# Patient Record
Sex: Female | Born: 1950 | Race: White | Hispanic: No | Marital: Married | Smoking: Never smoker
Health system: Southern US, Community
[De-identification: ages and names within clinical notes are randomized; demographics above are authoritative.]

## PROBLEM LIST (undated history)

## (undated) DIAGNOSIS — N83201 Unspecified ovarian cyst, right side: Secondary | ICD-10-CM

## (undated) DIAGNOSIS — N800 Endometriosis of uterus: Secondary | ICD-10-CM

## (undated) DIAGNOSIS — N83202 Unspecified ovarian cyst, left side: Secondary | ICD-10-CM

## (undated) DIAGNOSIS — I491 Atrial premature depolarization: Secondary | ICD-10-CM

## (undated) DIAGNOSIS — E7800 Pure hypercholesterolemia, unspecified: Secondary | ICD-10-CM

## (undated) DIAGNOSIS — N939 Abnormal uterine and vaginal bleeding, unspecified: Secondary | ICD-10-CM

## (undated) DIAGNOSIS — I499 Cardiac arrhythmia, unspecified: Secondary | ICD-10-CM

## (undated) DIAGNOSIS — K589 Irritable bowel syndrome without diarrhea: Secondary | ICD-10-CM

## (undated) DIAGNOSIS — N809 Endometriosis, unspecified: Secondary | ICD-10-CM

## (undated) DIAGNOSIS — D8989 Other specified disorders involving the immune mechanism, not elsewhere classified: Secondary | ICD-10-CM

## (undated) DIAGNOSIS — Z9189 Other specified personal risk factors, not elsewhere classified: Secondary | ICD-10-CM

## (undated) DIAGNOSIS — N8003 Adenomyosis of the uterus: Secondary | ICD-10-CM

## (undated) DIAGNOSIS — D219 Benign neoplasm of connective and other soft tissue, unspecified: Secondary | ICD-10-CM

## (undated) DIAGNOSIS — I493 Ventricular premature depolarization: Secondary | ICD-10-CM

## (undated) DIAGNOSIS — K219 Gastro-esophageal reflux disease without esophagitis: Secondary | ICD-10-CM

## (undated) HISTORY — DX: Abnormal uterine and vaginal bleeding, unspecified: N93.9

## (undated) HISTORY — DX: Pure hypercholesterolemia, unspecified: E78.00

## (undated) HISTORY — DX: Unspecified ovarian cyst, right side: N83.201

## (undated) HISTORY — DX: Irritable bowel syndrome, unspecified: K58.9

## (undated) HISTORY — DX: Unspecified ovarian cyst, left side: N83.202

## (undated) HISTORY — DX: Endometriosis of uterus: N80.0

## (undated) HISTORY — DX: Other specified personal risk factors, not elsewhere classified: Z91.89

## (undated) HISTORY — DX: Benign neoplasm of connective and other soft tissue, unspecified: D21.9

## (undated) HISTORY — DX: Adenomyosis of the uterus: N80.03

## (undated) HISTORY — DX: Endometriosis, unspecified: N80.9

## (undated) HISTORY — PX: OTHER SURGICAL HISTORY: SHX169

## (undated) HISTORY — DX: Other specified disorders involving the immune mechanism, not elsewhere classified: D89.89

---

## 1999-08-23 HISTORY — PX: VAGINAL HYSTERECTOMY: SUR661

## 1999-12-28 ENCOUNTER — Encounter (INDEPENDENT_AMBULATORY_CARE_PROVIDER_SITE_OTHER): Payer: Self-pay | Admitting: Specialist

## 1999-12-28 ENCOUNTER — Inpatient Hospital Stay (HOSPITAL_COMMUNITY): Admission: RE | Admit: 1999-12-28 | Discharge: 1999-12-30 | Payer: Self-pay | Admitting: Obstetrics and Gynecology

## 2002-11-14 ENCOUNTER — Other Ambulatory Visit: Admission: RE | Admit: 2002-11-14 | Discharge: 2002-11-14 | Payer: Self-pay | Admitting: Obstetrics and Gynecology

## 2003-11-26 ENCOUNTER — Other Ambulatory Visit: Admission: RE | Admit: 2003-11-26 | Discharge: 2003-11-26 | Payer: Self-pay | Admitting: Obstetrics and Gynecology

## 2004-11-30 ENCOUNTER — Other Ambulatory Visit: Admission: RE | Admit: 2004-11-30 | Discharge: 2004-11-30 | Payer: Self-pay | Admitting: Obstetrics and Gynecology

## 2005-07-21 ENCOUNTER — Encounter (INDEPENDENT_AMBULATORY_CARE_PROVIDER_SITE_OTHER): Payer: Self-pay | Admitting: Specialist

## 2005-07-21 ENCOUNTER — Ambulatory Visit (HOSPITAL_BASED_OUTPATIENT_CLINIC_OR_DEPARTMENT_OTHER): Admission: RE | Admit: 2005-07-21 | Discharge: 2005-07-21 | Payer: Self-pay | Admitting: Obstetrics and Gynecology

## 2005-07-21 HISTORY — PX: OOPHORECTOMY: SHX86

## 2005-12-05 ENCOUNTER — Other Ambulatory Visit: Admission: RE | Admit: 2005-12-05 | Discharge: 2005-12-05 | Payer: Self-pay | Admitting: Obstetrics and Gynecology

## 2006-12-13 ENCOUNTER — Other Ambulatory Visit: Admission: RE | Admit: 2006-12-13 | Discharge: 2006-12-13 | Payer: Self-pay | Admitting: Obstetrics and Gynecology

## 2008-01-02 ENCOUNTER — Other Ambulatory Visit: Admission: RE | Admit: 2008-01-02 | Discharge: 2008-01-02 | Payer: Self-pay | Admitting: Obstetrics and Gynecology

## 2009-03-11 ENCOUNTER — Other Ambulatory Visit: Admission: RE | Admit: 2009-03-11 | Discharge: 2009-03-11 | Payer: Self-pay | Admitting: Obstetrics and Gynecology

## 2009-03-11 ENCOUNTER — Ambulatory Visit: Payer: Self-pay | Admitting: Obstetrics and Gynecology

## 2009-03-11 ENCOUNTER — Encounter: Payer: Self-pay | Admitting: Obstetrics and Gynecology

## 2009-04-06 ENCOUNTER — Ambulatory Visit: Payer: Self-pay | Admitting: Obstetrics and Gynecology

## 2010-02-16 ENCOUNTER — Ambulatory Visit: Payer: Self-pay | Admitting: Gynecology

## 2010-03-12 ENCOUNTER — Other Ambulatory Visit: Admission: RE | Admit: 2010-03-12 | Discharge: 2010-03-12 | Payer: Self-pay | Admitting: Obstetrics and Gynecology

## 2010-03-12 ENCOUNTER — Ambulatory Visit: Payer: Self-pay | Admitting: Obstetrics and Gynecology

## 2011-01-07 NOTE — H&P (Signed)
[facility]  Patient:    Shelia May, Shelia May                       MRN: [id number] Attending:  Rande Brunt. Eda Paschal, M.D.                         History and Physical  CHIEF COMPLAINT:  Symptomatic fibroids.  HISTORY OF PRESENT ILLNESS:  The patient is a 60 year old, gravida 2, para 2, AB 0 who has been followed in the office for a long period of time. Approximately four to five years ago, she started to complain of severe menorrhagia with small fibroids. She was started on oral contraceptives with remission of her symptoms. She had a previous D&C for evaluation of this which was fine. She did well on her oral contraceptives for a while. They were switched to continuous at one point because of menstrual headaches but she continued to well without menorrhagia. Then approximately a year ago, she started to complain of lower abdominal pelvic crampy pain. On ultrasound, she had multiple leiomyoma. She had a total of 6 of them the largest of which was 3 cm. She also had 1 that was 2 cm the others were smaller. Because her pain was slightly atypical, she also had a GI evaluation including a negative colonoscopy.  The pain persisted, no medication was successful and it was felt that the only thing left to explain her pain was her fibroids and therefore she was placed on Depot Lupron. In November 2000, her pills were stopped and she was watched to see if that would control her pain. Indeed it did control her pain, her pain completely disappeared. The fibroids got smaller. She stayed on that until the end of April. The Depot Lupron was continued and her pain immediately resurfaced. She now enters the hospital for a vaginal hysterectomy to remove the fibroids to control the menorrhagia and hopefully to control her pain. She has given me permission to remove one or both ovaries for disease only.  PAST MEDICAL HISTORY:  No serious illnesses. 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 Has also been treated with Prevacid for chronic GI disorder. Present medications, however, are just her vitamins and her oral contraceptives.  ALLERGIES:  FAMILY HISTORY:  Negative for diabetes, hypertension, colon, ovarian or uterine cancer, breast cancer or heart disease.  SOCIAL HISTORY:  She is a nonsmoker. She drinks alcohol occasionally and she has caffeine frequently.  REVIEW OF SYSTEMS:  HEENT:  Negative.  CARDIAC:  Negative. RESPIRATORY: Negative. 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 GU:  Negative. NEUROMUSCULAR:  Negative.  ENDOCRINE: Negative.  PHYSICAL EXAMINATION:  GENERAL:  The patient is a well-developed, well-nourished female in no acute distress.  VITAL SIGNS:  Blood pressure is 114/66, pulse is 80 and regular, respiratory rate 16 nonlabored. She is afebrile.  HEENT:  All within normal limits.  NECK:  Supple, trach in the midline.  Thyroid is not enlarged.  LUNGS:  Clear to P&A.  HEART:  No thrills heaves or murmurs.  BREASTS:  No masses.  ABDOMEN:  Soft without guarding, rebound or mass.  PELVIC:  External and vaginal is within normal limits. Cervix is clean. Pap smear shows no atypia. Uterus is top normal size and shape with her myomas having shrunk from her Lupron. Adnexa  are palpably normal.  RECTAL:  Negative.  EXTREMITIES:  Within normal limits.  ADMISSION IMPRESSION:  Symptomatic fibroids.  PLAN:  Vaginal hysterectomy for correction of the above. DD:  12/28/99 TD:  12/28/99 Job: [id number]

## 2011-01-07 NOTE — Discharge Summary (Signed)
[facility]  Patient:    Shelia May, Shelia May                  MRN: [id number] Adm. Date:  [id number] Disc. Date: [id number] Attending:  Sharon Mt                           Discharge Summary  HISTORY:  The patient is a 60 year old female who came in with symptomatic fibroids with abdominal cramping, dysmenorrhea and menorrhagia for definitive surgery.  On the day of admission she was taken to the operating room and a vaginal hysterectomy was performed.  It was done under epidural anesthesia and the night of the surgery and the next day the patient had a lot of trouble with severe nausea and vomiting.  The epidural was discontinued.  This helped somewhat, but it continued when we started PCA morphine.  After stopping the PCA morphine the nausea finally cleared.  By the second postoperative day she was voiding well, passing gas, and was ready for discharge.  DISCHARGE MEDICATIONS:  Motrin 800 mg every 4-6 hours p.r.n. pain with a prescription for Tylox for backup if needed.  DISCHARGE DIET:  Soft, to advance as tolerated.  DRUG ACTIVITY:  Regular.  The patient is to return to the office in 3-4 weeks for followup.  FINAL PATHOLOGY REPORT:  Multiple leiomyoma and adenomyosis.  CONDITION ON DISCHARGE:  Improved.  DISCHARGE DIAGNOSES:  1. Adenomyosis.  2. Leiomyoma.  3. Dysmenorrhea.  4. Menorrhagia.  5. Abdominal cramping.  OPERATION:  Vaginal hysterectomy. DD:  12/30/99 TD:  01/03/00 Job: [id number]

## 2011-01-07 NOTE — Op Note (Signed)
[facility]  Patient:    Shelia May, Shelia May                  MRN: [id number] Proc. Date: 12/28/99 Adm. Date:  [id number] Attending:  Sharon Mt                           Operative Report  PREOPERATIVE DIAGNOSIS:  Symptomatic fibroids.  POSTOPERATIVE DIAGNOSIS:  Symptomatic fibroids.  OPERATION:  Vaginal hysterectomy.  SURGEON:  Daniel L. Eda Paschal, M.D.  FIRST ASSISTANT:  Douglass Rivers, M.D.  ANESTHESIA:  Epidural.  FINDINGS:  At the time of hysterectomy, patients uterus was just barely enlarged.  She has had multiple myomas seen on ultrasound but has been on Depo-Lupron and they have been suppressed.  Ovaries, fallopian tubes and pelvic peritoneum were free of any disease.  DESCRIPTION OF PROCEDURE:  After adequate epidural anesthesia, patient was taken to the operating room, placed in the dorsal lithotomy position and prepped and draped in the usual sterile manner.  A 1:200,000 solution of epinephrine and 0.5% Xylocaine was injected around the cervix.  A 360 degree incision was made around the cervix.  The bladder was mobilized superiorly, as was the posterior peritoneum.  Posterior peritoneum and vesicouterine fold of peritoneum were entered by sharp dissection.  The uterosacral ligaments were clamped and in clamping, they were shortened; they were then sutured to the vaginal cuff laterally for good vault support.  Cardinal ligaments were clamped, cut and suture ligated.  Uterine arteries were clamped, cut and doubly suture-ligated.  Balance of the broad ligament, uteroovarian ligaments, round ligaments and fallopian tubes were clamped, cut and doubly suture-ligated.  Suture material for all the above-mentioned pedicles was #1 chromic catgut.  The uterus was delivered and sent to pathology for tissue diagnosis.  The ovaries, tubes and pelvic peritoneum were inspected and were free of disease.  The vaginal cuff was whipstitched with  a running-locking 0 Vicryl.  Copious irrigation was done with sterile saline.  A modified McCalls enterocele prevention suture was placed with 2-0 Vicryl, incorporating the uterosacral ligaments and the posterior peritoneum.  The cuff and peritoneum were closed with figure-of-eights of #1 chromic catgut. Estimated blood loss for the entire procedure was less than 50 cc with none replaced.  At termination of procedure, Foley catheter was placed which drained clear urine.  Patient left the operating room in satisfactory condition.DD:  12/28/99 TD:  12/29/99 Job: [id number]

## 2011-01-07 NOTE — Op Note (Signed)
NAME:  Shelia May, Shelia May              ACCOUNT NO.:  [id number]   MEDICAL RECORD NO.:  [id number]          PATIENT TYPE:  AMB   LOCATION:  [facility]                         FACILITY:  [facility]   PHYSICIAN:  Daniel L. Gottsegen, M.D.DATE OF BIRTH:  01-13-1951   DATE OF PROCEDURE:  07/21/2005  DATE OF DISCHARGE:                                 OPERATIVE REPORT   PREOPERATIVE DIAGNOSIS:  Pelvic pain, recurrent ovarian cysts, endometriosis  suspected.   POSTOPERATIVE DIAGNOSIS:  Pelvic pain, recurrent ovarian cysts,  endometriosis.   OPERATIONS:  Diagnostic laparoscopy with lysis of pelvic adhesions,  bilateral salpingo-oophorectomy.   SURGEON:  Dr. Eda Paschal   FIRST ASSISTANT:  Dr. Audie Box   ANESTHESIA:  General endotracheal.   INDICATIONS:  The patient is a 60 year old female, postmenopausal, status  post vaginal hysterectomy for dysmenorrhea DUB with endometriosis found,  who, over the past year has had progressive increase of pelvic pain,  especially on the left side.  On several occasions, she has had ovarian  cysts which did spontaneously resolve, but her pain has persisted.  Her  estrogen was stopped, and she was started on Depo-Provera, and her pain  completely resolved; however, she was symptomatic in terms of vasomotor  symptoms and as soon as her estrogen was reinitiated, her pain reoccurred.  She now enters the hospital for diagnostic laparoscopy with bilateral  salpingo-oophorectomy.   FINDINGS AT SURGERY:  The patient's left ovary appeared enlarged, especially  for a postmenopausal woman.  There was a clear pigmented area of  endometriosis associated with the left ovary.  Right ovary and tube were  normal, although the right ovary was adherent to epiploa of the large bowel.  There were also some adhesions of large bowel to the vesicouterine fold of  peritoneum.   PROCEDURE:  After adequate general endotracheal anesthesia, the patient was  placed in the dorsal supine  position, prepped and draped in the usual  sterile manner.  Foley catheter was inserted into the patient's bladder.  A  sponge stick was placed in the vagina.  A pneumoperitoneum was created  subumbilically with a Veress needle without incident.  The incision was  extended.  A 10 mm trocar and the diagnostic laparoscope was placed through  the same incision.  The pelvis was visualized.  There had been no injury  during the above, and the above findings were noted.  Two 5 mm ports were  placed in the pelvis, one on the right and one in the left.  Through that, a  variety of instruments were placed.  First, the left ovary and tube were  removed.  The ovary and tube were elevated.  The ureter was identified.  The  IP ligament was bipolared and cut, and the rest of the attachments to the  adnexa to the broad ligament were bipolared and cut.  At this point, the  adhesions described previously were seen and using a scissors attached to  unipolar current, keeping very far from the sigmoid colon, all adhesions  could be freed up so that the large bowel was now free.  There were several  oozers  in the mesentery which were controlled with precise bipolar  coagulation.  Attention was next turned to the right ovary.  The ureter was  identified.  The IP ligament was bipolared and cut, as were the rest of the  attachments of the right ovary to the broad ligament.  Now both adnexa were  free.  There was no bleeding noted.  A 5 mm laparoscope was placed in the  pelvis.  An Endopouch was placed subumbilically, and the specimens were  removed and sent to pathology for tissue diagnosis.  Copious irrigation had  been done with Ringer's lactate prior to this.  All trocars were removed.  The subumbilical fascial incision was closed with 0 Vicryl, and the 3 skin  incisions were closed with 3-0 Monocryl.  Estimated blood loss for the  entire procedure was less than 50 mL with none replaced.  The patient  tolerated  the procedure well and left the operating room in satisfactory  condition, draining clear urine from her Foley catheter which was to be  removed.      Daniel L. Eda Paschal, M.D.  Electronically Signed     DLG/MEDQ  D:  07/21/2005  T:  07/21/2005  Job:  [id number]

## 2011-03-02 ENCOUNTER — Encounter: Payer: Self-pay | Admitting: Anesthesiology

## 2011-03-14 ENCOUNTER — Other Ambulatory Visit: Payer: Self-pay | Admitting: Obstetrics and Gynecology

## 2011-03-14 MED ORDER — ESTRADIOL 1 MG PO TABS: 1.0000 mg | ORAL_TABLET | Freq: Every day | ORAL | Status: DC

## 2011-03-14 NOTE — Telephone Encounter (Signed)
CALLED IN RX TO [facility]. PT HAS AEX PENDING ON 04/15/11.

## 2011-04-15 ENCOUNTER — Ambulatory Visit (INDEPENDENT_AMBULATORY_CARE_PROVIDER_SITE_OTHER): Payer: BC Managed Care – PPO | Admitting: Obstetrics and Gynecology

## 2011-04-15 ENCOUNTER — Other Ambulatory Visit (HOSPITAL_COMMUNITY)
Admission: RE | Admit: 2011-04-15 | Discharge: 2011-04-15 | Disposition: A | Discharge status: Home / Self Care | Payer: BC Managed Care – PPO | Source: Ambulatory Visit | Attending: Obstetrics and Gynecology | Admitting: Obstetrics and Gynecology

## 2011-04-15 ENCOUNTER — Encounter: Payer: Self-pay | Admitting: Obstetrics and Gynecology

## 2011-04-15 VITALS — BP 120/70 | Ht 62.0 in | Wt 175.0 lb

## 2011-04-15 DIAGNOSIS — N939 Abnormal uterine and vaginal bleeding, unspecified: Secondary | ICD-10-CM | POA: Insufficient documentation

## 2011-04-15 DIAGNOSIS — N8003 Adenomyosis of the uterus: Secondary | ICD-10-CM | POA: Insufficient documentation

## 2011-04-15 DIAGNOSIS — Z780 Asymptomatic menopausal state: Secondary | ICD-10-CM

## 2011-04-15 DIAGNOSIS — Z01419 Encounter for gynecological examination (general) (routine) without abnormal findings: Secondary | ICD-10-CM | POA: Insufficient documentation

## 2011-04-15 DIAGNOSIS — N951 Menopausal and female climacteric states: Secondary | ICD-10-CM

## 2011-04-15 DIAGNOSIS — D219 Benign neoplasm of connective and other soft tissue, unspecified: Secondary | ICD-10-CM | POA: Insufficient documentation

## 2011-04-15 DIAGNOSIS — N83201 Unspecified ovarian cyst, right side: Secondary | ICD-10-CM | POA: Insufficient documentation

## 2011-04-15 DIAGNOSIS — N809 Endometriosis, unspecified: Secondary | ICD-10-CM | POA: Insufficient documentation

## 2011-04-15 DIAGNOSIS — K589 Irritable bowel syndrome without diarrhea: Secondary | ICD-10-CM | POA: Insufficient documentation

## 2011-04-15 MED ORDER — HYOSCYAMINE SULFATE 0.125 MG PO TABS: 0.1250 mg | ORAL_TABLET | ORAL | Status: DC | PRN

## 2011-04-15 MED ORDER — ALPRAZOLAM 0.25 MG PO TABS: 0.2500 mg | ORAL_TABLET | Freq: Every evening | ORAL | Status: DC | PRN

## 2011-04-15 MED ORDER — ESTRADIOL 1 MG PO TABS: 1.0000 mg | ORAL_TABLET | Freq: Every day | ORAL | Status: DC

## 2011-04-15 NOTE — Progress Notes (Signed)
The patient came to see me today for her annual GYN exam. She remains on estradiol with good results for menopausal symptoms. She remains on Xanax which she does for menopausal symptoms and anxiety. She also uses Levsin for IBS with good results. She is having no vaginal bleeding. She is having no pelvic pain. She is due for both a mammogram and a bone density. She's only had one bone density in it was many years ago and [place]. She was told it was normal. Patient is having some trouble with vaginal dryness but doesn't feel she's ready to go on vaginal estrogen  HEENT: Within normal limits. Neck: No masses. Supraclavicular lymph nodes: Not enlarged. Breasts: Examined in both sitting and lying position. Symmetrical without skin changes or masses. Abdomen: Soft no masses guarding or rebound. No hernias. Pelvic: External within normal limits. BUS within normal limits. Vaginal examination shows good estrogen effect, no cystocele enterocele or rectocele. Cervix and uterus absent. Adnexa within normal limits. Rectovaginal confirmatory. Extremities within normal limits.   Assessment: 1. Menopausal symptoms 2. Atrophic vaginitis 3. IBS  Plan: 1. Continue medication as above 2. Mammogram in bone density at [facility] 3. Call when necessary need for vaginal estrogen

## 2011-05-04 ENCOUNTER — Encounter: Payer: Self-pay | Admitting: Obstetrics and Gynecology

## 2011-05-09 ENCOUNTER — Other Ambulatory Visit: Payer: Self-pay | Admitting: Obstetrics and Gynecology

## 2011-05-24 ENCOUNTER — Other Ambulatory Visit: Payer: Self-pay | Admitting: Obstetrics and Gynecology

## 2011-07-11 ENCOUNTER — Encounter: Payer: Self-pay | Admitting: Obstetrics and Gynecology

## 2012-05-09 ENCOUNTER — Encounter: Payer: Self-pay | Admitting: Obstetrics and Gynecology

## 2012-05-09 ENCOUNTER — Other Ambulatory Visit (HOSPITAL_COMMUNITY)
Admission: RE | Admit: 2012-05-09 | Discharge: 2012-05-09 | Disposition: A | Discharge status: Home / Self Care | Payer: BC Managed Care – PPO | Source: Ambulatory Visit | Attending: Obstetrics and Gynecology | Admitting: Obstetrics and Gynecology

## 2012-05-09 ENCOUNTER — Ambulatory Visit (INDEPENDENT_AMBULATORY_CARE_PROVIDER_SITE_OTHER): Payer: BC Managed Care – PPO | Admitting: Obstetrics and Gynecology

## 2012-05-09 VITALS — BP 108/70 | Ht 62.0 in | Wt 176.0 lb

## 2012-05-09 DIAGNOSIS — Z91B Personal risk factor of exposure to diethylstilbestrol: Secondary | ICD-10-CM | POA: Insufficient documentation

## 2012-05-09 DIAGNOSIS — Z9189 Other specified personal risk factors, not elsewhere classified: Secondary | ICD-10-CM

## 2012-05-09 DIAGNOSIS — Z780 Asymptomatic menopausal state: Secondary | ICD-10-CM

## 2012-05-09 DIAGNOSIS — Z01419 Encounter for gynecological examination (general) (routine) without abnormal findings: Secondary | ICD-10-CM | POA: Insufficient documentation

## 2012-05-09 MED ORDER — DICYCLOMINE HCL 20 MG PO TABS: 20.0000 mg | ORAL_TABLET | Freq: Four times a day (QID) | ORAL | Status: DC

## 2012-05-09 MED ORDER — ALPRAZOLAM 0.25 MG PO TABS: 0.2500 mg | ORAL_TABLET | Freq: Every evening | ORAL | Status: DC | PRN

## 2012-05-09 MED ORDER — ESTRADIOL 1 MG PO TABS: 1.0000 mg | ORAL_TABLET | Freq: Every day | ORAL | Status: DC

## 2012-05-09 NOTE — Addendum Note (Signed)
Addended by: Richardson Chiquito on: 05/09/2012 12:27 PM   Modules accepted: Orders

## 2012-05-09 NOTE — Patient Instructions (Signed)
Continue yearly mammograms 

## 2012-05-09 NOTE — Progress Notes (Signed)
Patient came to see me today for her annual GYN exam. She remains on estradiol for hormone replacement with excellent results. She has had no vaginal bleeding or pelvic pain. She does not have dyspareunia. She had a vaginal hysterectomy in 2001 for fibroids, dysfunctional uterine bleeding, endometriosis. She had a diagnostic laparoscopy with bilateral salpingo-oophorectomy in 2006 for ovarian cysts and endometriosis. She has always had normal Pap smears. She is a DES progeny. We have been giving her Bentyll for many years for IBS with good results. She does her lab through her PCP. She is up-to-date on mammograms. She does her bone density through her PCP and they are  normal. She uses Xanax as needed that we provide with no problems. Her last Pap smear was 2012.  HEENT: Within normal limits.Shelia May present. Neck: No masses. Supraclavicular lymph nodes: Not enlarged. Breasts: Examined in both sitting and lying position. Symmetrical without skin changes or masses. Abdomen: Soft no masses guarding or rebound. No hernias. Pelvic: External within normal limits. BUS within normal limits. Vaginal examination shows good estrogen effect, no cystocele enterocele or rectocele. Cervix and uterus absent. Adnexa within normal limits. Rectovaginal confirmatory. Extremities within normal limits.  Assessment: Menopausal symptoms. DES progeny. Irritable bowel syndrome.  Plan: Continue estradiol and Bentyl. Prescription for Xanax written. Continue yearly mammograms.

## 2012-05-10 LAB — URINALYSIS W MICROSCOPIC + REFLEX CULTURE
Bacteria, UA: NONE SEEN
Bilirubin Urine: NEGATIVE
Casts: NONE SEEN
Crystals: NONE SEEN
Glucose, UA: NEGATIVE mg/dL
Hgb urine dipstick: NEGATIVE
Ketones, ur: NEGATIVE mg/dL
Leukocytes, UA: NEGATIVE
Nitrite: NEGATIVE
Protein, ur: NEGATIVE mg/dL
Specific Gravity, Urine: 1.015 (ref 1.005–1.030)
Squamous Epithelial / HPF: NONE SEEN
Urobilinogen, UA: 0.2 mg/dL (ref 0.0–1.0)
pH: 5 (ref 5.0–8.0)

## 2012-07-16 ENCOUNTER — Encounter: Payer: Self-pay | Admitting: Obstetrics and Gynecology

## 2012-08-13 ENCOUNTER — Other Ambulatory Visit: Payer: Self-pay | Admitting: Obstetrics and Gynecology

## 2013-02-01 ENCOUNTER — Other Ambulatory Visit: Payer: Self-pay

## 2013-02-01 DIAGNOSIS — Z780 Asymptomatic menopausal state: Secondary | ICD-10-CM

## 2013-02-01 MED ORDER — ALPRAZOLAM 0.25 MG PO TABS: 0.2500 mg | ORAL_TABLET | Freq: Every evening | ORAL | Status: AC | PRN

## 2013-02-01 NOTE — Telephone Encounter (Signed)
Former patient of Dr. Reece Agar and current with [facility] with last one 05/09/12.  At 2012 CE Dr. Reece Agar wrote: "She remains on Xanax which she does for menopausal symptoms and anxiety."  Of note, the pharmacy noted that the patient has unused refills left that have expired because it is controlled drug.

## 2013-02-01 NOTE — Telephone Encounter (Signed)
rx called in

## 2013-05-27 ENCOUNTER — Other Ambulatory Visit: Payer: Self-pay | Admitting: Obstetrics and Gynecology

## 2013-05-29 ENCOUNTER — Other Ambulatory Visit: Payer: Self-pay | Admitting: Obstetrics and Gynecology

## 2013-06-04 ENCOUNTER — Other Ambulatory Visit: Payer: Self-pay | Admitting: Obstetrics and Gynecology

## 2013-06-07 ENCOUNTER — Other Ambulatory Visit: Payer: Self-pay | Admitting: Obstetrics and Gynecology

## 2013-07-03 ENCOUNTER — Other Ambulatory Visit: Payer: Self-pay | Admitting: Gynecology

## 2013-07-03 NOTE — Telephone Encounter (Signed)
Pt will be called to schedule annual exam

## 2013-08-13 ENCOUNTER — Other Ambulatory Visit: Payer: Self-pay | Admitting: Gynecology

## 2013-11-15 ENCOUNTER — Other Ambulatory Visit: Payer: Self-pay | Admitting: Gynecology

## 2014-06-23 ENCOUNTER — Encounter: Payer: Self-pay | Admitting: Obstetrics and Gynecology

## 2015-07-28 DIAGNOSIS — F419 Anxiety disorder, unspecified: Secondary | ICD-10-CM | POA: Insufficient documentation

## 2015-07-28 DIAGNOSIS — F1320 Sedative, hypnotic or anxiolytic dependence, uncomplicated: Secondary | ICD-10-CM | POA: Insufficient documentation

## 2015-07-28 DIAGNOSIS — E785 Hyperlipidemia, unspecified: Secondary | ICD-10-CM | POA: Insufficient documentation

## 2015-07-28 DIAGNOSIS — E039 Hypothyroidism, unspecified: Secondary | ICD-10-CM | POA: Insufficient documentation

## 2015-07-28 DIAGNOSIS — Z79890 Hormone replacement therapy (postmenopausal): Secondary | ICD-10-CM | POA: Insufficient documentation

## 2016-02-18 DIAGNOSIS — G4700 Insomnia, unspecified: Secondary | ICD-10-CM | POA: Insufficient documentation

## 2016-02-18 DIAGNOSIS — K219 Gastro-esophageal reflux disease without esophagitis: Secondary | ICD-10-CM | POA: Insufficient documentation

## 2017-12-20 ENCOUNTER — Ambulatory Visit (INDEPENDENT_AMBULATORY_CARE_PROVIDER_SITE_OTHER): Payer: Medicare Other | Admitting: Internal Medicine

## 2017-12-20 ENCOUNTER — Encounter: Payer: Self-pay | Admitting: Internal Medicine

## 2017-12-20 VITALS — BP 118/78 | HR 69 | Ht 62.0 in | Wt 183.4 lb

## 2017-12-20 DIAGNOSIS — I493 Ventricular premature depolarization: Secondary | ICD-10-CM | POA: Insufficient documentation

## 2017-12-20 DIAGNOSIS — R400 Somnolence: Secondary | ICD-10-CM

## 2017-12-20 DIAGNOSIS — R0681 Apnea, not elsewhere classified: Secondary | ICD-10-CM | POA: Insufficient documentation

## 2017-12-20 DIAGNOSIS — R002 Palpitations: Secondary | ICD-10-CM | POA: Insufficient documentation

## 2017-12-20 DIAGNOSIS — R0683 Snoring: Secondary | ICD-10-CM

## 2017-12-20 DIAGNOSIS — R0602 Shortness of breath: Secondary | ICD-10-CM | POA: Diagnosis not present

## 2017-12-20 DIAGNOSIS — R079 Chest pain, unspecified: Secondary | ICD-10-CM | POA: Insufficient documentation

## 2017-12-20 MED ORDER — METOPROLOL SUCCINATE ER 25 MG PO TB24: 25.0000 mg | ORAL_TABLET | Freq: Every day | ORAL | 5 refills | Status: DC

## 2017-12-20 NOTE — Progress Notes (Signed)
OFFICE CONSULT NOTE  Chief Complaint:  Chest pressure, palpitations  Primary Care Physician: Karlene Einstein, MD  HPI:  Shelia May is a 67 y.o. female who is being seen today for the evaluation of chest pressure and palpitations at the request of Haimes, Youlanda Roys, MD.  This is a pleasant 67 year old female who is kindly referred to me for evaluation of chest pressure and palpitations.  She relates back to the night of October 27, 2017.  She was awakened with a feeling of her heart racing.  She was noted to have tachycardia on her Fitbit.  She is felt on and off palpitations since about that time.  She was [place] and presented to [facility].  She was noted to have predominantly PACs and some PVCs.  She was prescribed a beta-blocker and notes some mild improvement in her symptoms.  She saw her primary care provider who referred her because she is also been having some chest pressure.  She describes a sensation of a pressure heaviness in the mid central chest between the breasts, not worse with exertion or relieved by rest.  Is not associated with palpitations necessarily.  She thought at first it May be reflux but she has been on reflux treatment for some time.  She also notes that she gets short of breath and her heart rate goes up rapidly over 110-120 beats a minute with minimal exertion.  This apparently is a relatively new finding.  She does have a history of hypothyroidism but appears to be euthyroid on medication.  She takes Xanax, but mentioned it was mostly for sleep.  She does note some difficulty sleeping at night, has been told that she snores by her husband who was present during her office visit and she is found herself gasping for breath.  She occasionally wakes up with headaches in the morning and can feel tired during the day.  She completed the Epworth Sleepiness Scale and her score was 7.  Despite this she is at increased risk for sleep apnea.  Family history  significant for heart attack in her grandfather however no first-degree relatives with heart disease.  She is a non-smoker.  She drinks generally about 1 drink per day.  She is cut this back as well at her caffeine use.  PMHx:  Past Medical History:  Diagnosis Date  . Abnormal uterine bleeding   . Adenomyosis   . 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 Bilateral ovarian cysts    RECURRENT   . Endometriosis   . Fibroid   . High cholesterol   . 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 IBS (irritable bowel syndrome)     Past Surgical History:  Procedure Laterality Date  . DIAGNOSTIC LAP BSO    . OOPHORECTOMY  07/21/2005   DIAGNOSTIC  LAPAROSCOPY WITH LYSIS OF ADHESIONS , BSO  . VAGINAL HYSTERECTOMY  2001   VAGINAL     FAMHx:  Family History  Problem Relation Age of Onset  . Heart disease Maternal Grandfather     SOCHx:   reports that she has never smoked. She has never used smokeless tobacco. She reports that she drinks alcohol. She reports that she does not use drugs.  ALLERGIES:  Allergies  Allergen Reactions  . Morphine And Related Nausea And Vomiting  . Zolpidem Other (See Comments)    Sleep walking.  "Sleep walk" Sleep walking.      ROS:  Pertinent items noted in HPI and remainder of comprehensive ROS otherwise negative.  HOME MEDS: Current Outpatient Medications on File Prior to Visit  Medication Sig Dispense Refill  . ALPRAZolam (XANAX) 0.25 MG tablet Take 1 tablet (0.25 mg total) by mouth at bedtime as needed. 30 tablet 3  . dicyclomine (BENTYL) 20 MG tablet Take 1 tablet (20 mg total) by mouth every 6 (six) hours. 90 tablet 3  . diphenhydrAMINE (BENADRYL) 25 mg capsule Take 25 mg by mouth at bedtime as needed.    Marland Kitchen estradiol (ESTRACE) 1 MG tablet TAKE 1 TABLET (1 MG TOTAL) BY MOUTH DAILY. 30 tablet 0  . glucosamine-chondroitin 500-400 MG tablet Take 2 tablets by mouth daily.    Marland Kitchen levothyroxine (SYNTHROID, LEVOTHROID) 50 MCG tablet Take 1  tablet by mouth daily.    . Multiple Vitamin (MULTIVITAMIN) tablet Take 1 tablet by mouth daily.    Marland Kitchen omeprazole (PRILOSEC) 20 MG capsule Take 1 capsule by mouth daily.    . pravastatin (PRAVACHOL) 10 MG tablet Take 10 mg by mouth daily.       No current facility-administered medications on file prior to visit.     LABS/IMAGING: No results found for this or any previous visit (from the past 48 hour(s)). No results found.  LIPID PANEL: No results found for: CHOL, TRIG, HDL, CHOLHDL, VLDL, LDLCALC, LDLDIRECT  WEIGHTS: Wt Readings from Last 3 Encounters:  12/20/17 183 lb 6.4 oz (83.2 kg)  05/09/12 176 lb (79.8 kg)  04/15/11 175 lb (79.4 kg)    VITALS: BP 118/78   Pulse 69   Ht 5\' 2"  (1.575 m)   Wt 183 lb 6.4 oz (83.2 kg)   BMI 33.54 kg/m   EXAM: General appearance: alert and no distress Neck: no carotid bruit, no JVD and thyroid not enlarged, symmetric, no tenderness/mass/nodules Lungs: clear to auscultation bilaterally Heart: regular rate and rhythm Abdomen: soft, non-tender; bowel sounds normal; no masses,  no organomegaly and obese Extremities: extremities normal, atraumatic, no cyanosis or edema Pulses: 2+ and symmetric Skin: Skin color, texture, turgor normal. No rashes or lesions Neurologic: Grossly normal Psych: Pleasant  EKG: Sinus rhythm with occasional PVCs at 69, poor anterior R wave progression-personally reviewed  ASSESSMENT: 1. Palpitations-PACs and PVCs 2. Central chest pressure 3. Snoring and witnessed apnea  PLAN: 1.   [name] is describing palpitations and has been noted to have PACs and PVCs.  Sometimes she feels like she has rapid paroxysmal arrhythmias which could be an SVT or A. fib.  I like to place a two-week monitor rule out those.  She is also describing central chest pressure.  This is mostly recently and different from her reflux symptoms.  Will obtain an exercise Myoview given her history of PVCs, age and dyslipidemia.  Finally, she  reports snoring, witnessed apnea, morning headaches and fatigue during the day with an Epworth Sleepiness Scale score of 7.  This could represent obstructive sleep apnea.  I like to refer her for sleep study.  Thanks again for the kind referral.  Follow-up with me afterwards.  [name], MD, [facility], [place] Director of the [facility] Lipid [facility] &  Cardiovascular [facility] Diplomate of the American Board of Clinical Lipidology Attending Cardiologist  Direct Dial: [phone]  Fax: [phone]  Website:  [email or web address] 12/20/2017, 4:44 PM

## 2017-12-20 NOTE — Patient Instructions (Addendum)
Your physician has recommended that you wear an event monitor for TWO WEEKS. Event monitors are medical devices that record the heart's electrical activity. Doctors most often [place] these monitors to diagnose arrhythmias. Arrhythmias are problems with the speed or rhythm of the heartbeat. The monitor is a small, portable device. You can wear one while you do your normal daily activities. This is usually used to diagnose what is causing palpitations/syncope (passing out). This is done at [address] - [address]  Dr. Debara Pickett has ordered an Exercise Myocardial Perfusion Imaging Study. This is done in Dr. Lysbeth Penner office.   The test will take approximately 3 to 4 hours to complete; you may bring reading material.  If someone comes with you to your appointment, they will need to remain in the main lobby due to limited space in the testing area. **If you are pregnant or breastfeeding, please notify the nuclear lab prior to your appointment**  You will need to hold the following medications prior to your stress test: metoprolol succinate (24 hours prior to test)   How to prepare for your Myocardial Perfusion Test:  Do not eat or drink 3 hours prior to your test, except you may have water.  Do not consume products containing caffeine (regular or decaffeinated) 12 hours prior to your test. (ex: coffee, chocolate, sodas, tea).  Do wear comfortable clothes (no dresses or overalls) and walking shoes, tennis shoes preferred (No heels or open toe shoes are allowed).  Do NOT wear cologne, perfume, aftershave, or lotions (deodorant is allowed).  If these instructions are not followed, your test will have to be rescheduled.  Your physician has recommended that you have a sleep study. This test records several body functions during sleep, including: brain activity, eye movement, oxygen and carbon dioxide blood levels, heart rate and rhythm, breathing rate and rhythm, the flow of air through your mouth and  nose, snoring, body muscle movements, and chest and belly movement. This will need to be pre-authorized with your insurance before it can be scheduled.    Your physician recommends that you schedule a follow-up appointment after your testing

## 2017-12-26 ENCOUNTER — Telehealth: Payer: Self-pay | Admitting: *Deleted

## 2017-12-26 ENCOUNTER — Telehealth (HOSPITAL_COMMUNITY): Payer: Self-pay

## 2017-12-26 NOTE — Telephone Encounter (Signed)
Patient notified of sleep study appointment scheduled on 01/19/18. States that she is not sure if she can go that day. She was given the # to the sleep lab to call to reschedule if she needs to.

## 2017-12-26 NOTE — Telephone Encounter (Signed)
Encounter complete. 

## 2017-12-27 ENCOUNTER — Encounter (HOSPITAL_COMMUNITY): Payer: Medicare Other

## 2017-12-28 ENCOUNTER — Ambulatory Visit (INDEPENDENT_AMBULATORY_CARE_PROVIDER_SITE_OTHER): Payer: Medicare Other

## 2017-12-28 ENCOUNTER — Ambulatory Visit (HOSPITAL_COMMUNITY)
Admission: RE | Admit: 2017-12-28 | Discharge: 2017-12-28 | Disposition: A | Discharge status: Home / Self Care | Payer: Medicare Other | Source: Ambulatory Visit | Attending: Cardiovascular Disease | Admitting: Cardiovascular Disease

## 2017-12-28 DIAGNOSIS — E669 Obesity, unspecified: Secondary | ICD-10-CM | POA: Insufficient documentation

## 2017-12-28 DIAGNOSIS — R0602 Shortness of breath: Secondary | ICD-10-CM | POA: Diagnosis present

## 2017-12-28 DIAGNOSIS — I493 Ventricular premature depolarization: Secondary | ICD-10-CM | POA: Insufficient documentation

## 2017-12-28 DIAGNOSIS — E039 Hypothyroidism, unspecified: Secondary | ICD-10-CM | POA: Diagnosis not present

## 2017-12-28 DIAGNOSIS — R079 Chest pain, unspecified: Secondary | ICD-10-CM | POA: Diagnosis present

## 2017-12-28 DIAGNOSIS — Z8249 Family history of ischemic heart disease and other diseases of the circulatory system: Secondary | ICD-10-CM | POA: Insufficient documentation

## 2017-12-28 DIAGNOSIS — R0683 Snoring: Secondary | ICD-10-CM | POA: Diagnosis not present

## 2017-12-28 DIAGNOSIS — D72829 Elevated white blood cell count, unspecified: Secondary | ICD-10-CM | POA: Insufficient documentation

## 2017-12-28 DIAGNOSIS — R002 Palpitations: Secondary | ICD-10-CM | POA: Diagnosis not present

## 2017-12-28 LAB — MYOCARDIAL PERFUSION IMAGING
CHL CUP NUCLEAR SDS: 1
CHL CUP NUCLEAR SRS: 0
CSEPPHR: 157 {beats}/min
Estimated workload: 10.1 METS
Exercise duration (min): 9 min
Exercise duration (sec): 0 s
LV dias vol: 67 mL (ref 46–106)
LVSYSVOL: 21 mL
MPHR: 155 {beats}/min
NUC STRESS TID: 1.13
Percent HR: 101 %
RPE: 17
Rest HR: 61 {beats}/min
SSS: 1

## 2017-12-28 MED ORDER — TECHNETIUM TC 99M TETROFOSMIN IV KIT
29.7000 | PACK | Freq: Once | INTRAVENOUS | Status: AC | PRN
  Administered 2017-12-28: 29.7 via INTRAVENOUS
  Filled 2017-12-28: qty 30

## 2017-12-28 MED ORDER — TECHNETIUM TC 99M TETROFOSMIN IV KIT
10.1000 | PACK | Freq: Once | INTRAVENOUS | Status: AC | PRN
  Administered 2017-12-28: 10.1 via INTRAVENOUS
  Filled 2017-12-28: qty 11

## 2018-01-01 ENCOUNTER — Telehealth: Payer: Self-pay | Admitting: Internal Medicine

## 2018-01-01 MED ORDER — METOPROLOL SUCCINATE ER 25 MG PO TB24: 25.0000 mg | ORAL_TABLET | Freq: Every day | ORAL | 5 refills | Status: DC

## 2018-01-01 NOTE — Telephone Encounter (Signed)
New Message:       Pt c/o medication issue:  1. Name of Medication: metoprolol succinate (TOPROL-XL) 25 MG 24 hr tablet  2. How are you currently taking this medication (dosage and times per day)? Take 1 tablet (25 mg total) by mouth daily.  3. Are you having a reaction (difficulty breathing--STAT)? No  4. What is your medication issue? Pt is calling to see if she still needs to take this medication while wearing the holter monitor.  Pt states if she needs to continue this medication she will need a refill

## 2018-01-01 NOTE — Telephone Encounter (Signed)
Patient called and notified she should continue her current medications. Advised that Toprol was refilled on [facility] to [facility]. She would like a refill to [facility]. Rx(s) sent to pharmacy electronically.

## 2018-01-19 ENCOUNTER — Ambulatory Visit (HOSPITAL_BASED_OUTPATIENT_CLINIC_OR_DEPARTMENT_OTHER): Payer: Medicare Other | Attending: Internal Medicine

## 2018-01-23 ENCOUNTER — Ambulatory Visit (INDEPENDENT_AMBULATORY_CARE_PROVIDER_SITE_OTHER): Payer: Medicare Other | Admitting: Internal Medicine

## 2018-01-23 ENCOUNTER — Encounter: Payer: Self-pay | Admitting: Internal Medicine

## 2018-01-23 VITALS — BP 139/87 | HR 61 | Ht 62.0 in | Wt 184.6 lb

## 2018-01-23 DIAGNOSIS — I493 Ventricular premature depolarization: Secondary | ICD-10-CM | POA: Diagnosis not present

## 2018-01-23 DIAGNOSIS — R079 Chest pain, unspecified: Secondary | ICD-10-CM | POA: Diagnosis not present

## 2018-01-23 DIAGNOSIS — R0681 Apnea, not elsewhere classified: Secondary | ICD-10-CM | POA: Diagnosis not present

## 2018-01-23 DIAGNOSIS — R400 Somnolence: Secondary | ICD-10-CM

## 2018-01-23 NOTE — Progress Notes (Signed)
OFFICE CONSULT NOTE  Chief Complaint:  Follow-up stress test, monitor  Primary Care Physician: Karlene Einstein, MD  HPI:  Shelia May is a 67 y.o. female who is being seen today for the evaluation of chest pressure and palpitations at the request of Haimes, Youlanda Roys, MD.  This is a pleasant 67 year old female who is kindly referred to me for evaluation of chest pressure and palpitations.  She relates back to the night of October 27, 2017.  She was awakened with a feeling of her heart racing.  She was noted to have tachycardia on her Fitbit.  She is felt on and off palpitations since about that time.  She was at [place] and presented to [facility].  She was noted to have predominantly PACs and some PVCs.  She was prescribed a beta-blocker and notes some mild improvement in her symptoms.  She saw her primary care provider who referred her because she is also been having some chest pressure.  She describes a sensation of a pressure heaviness in the mid central chest between the breasts, not worse with exertion or relieved by rest.  Is not associated with palpitations necessarily.  She thought at first it may be reflux but she has been on reflux treatment for some time.  She also notes that she gets short of breath and her heart rate goes up rapidly over 110-120 beats a minute with minimal exertion.  This apparently is a relatively new finding.  She does have a history of hypothyroidism but appears to be euthyroid on medication.  She takes Xanax, but mentioned it was mostly for sleep.  She does note some difficulty sleeping at night, has been told that she snores by her husband who was present during her office visit and she is found herself gasping for breath.  She occasionally wakes up with headaches in the morning and can feel tired during the day.  She completed the Epworth Sleepiness Scale and her score was 7.  Despite this she is at increased risk for sleep apnea.  Family  history significant for heart attack in her grandfather however no first-degree relatives with heart disease.  She is a non-smoker.  She drinks generally about 1 drink per day.  She is cut this back as well at her caffeine use.  01/23/2018  Shelia May returns today for follow-up of her stress test and monitor.  The stress test showed no ischemia normal LV function with LVEF of 69%.  Her monitor demonstrated sinus rhythm with predominantly PVCs, but no atrial fibrillation, nonsustained VT or other significant arrhythmias.  She reports that she is no longer aware of any palpitations.  We discussed further her sleep abnormalities my concern for a sleep disorder.  She says she has difficulty sleeping at night and did go for sleep study on May 31, however she felt that she could not sleep and became very anxious about the study.  She was then told if she did not complete the study she may have to pay several thousand dollars.  She decided not to go through with it.  We discussed this further today and I explained to her what is actually seen in the study.  We also discussed possibility of doing overnight home oximetry, but that it may lead to a formal sleep study if abnormal.  She says that she would prefer to rather work on weight loss at this time.  PMHx:  Past Medical History:  Diagnosis Date  .  Abnormal uterine bleeding   . Adenomyosis   . 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 Bilateral ovarian cysts    RECURRENT   . Endometriosis   . Fibroid   . High cholesterol   . 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 IBS (irritable bowel syndrome)     Past Surgical History:  Procedure Laterality Date  . DIAGNOSTIC LAP BSO    . OOPHORECTOMY  07/21/2005   DIAGNOSTIC  LAPAROSCOPY WITH LYSIS OF ADHESIONS , BSO  . VAGINAL HYSTERECTOMY  2001   VAGINAL     FAMHx:  Family History  Problem Relation Age of Onset  . Heart disease Maternal Grandfather     SOCHx:   reports that she  has never smoked. She has never used smokeless tobacco. She reports that she drinks alcohol. She reports that she does not use drugs.  ALLERGIES:  Allergies  Allergen Reactions  . Morphine And Related Nausea And Vomiting  . Zolpidem Other (See Comments)    Sleep walking.  "Sleep walk" Sleep walking.      ROS: Pertinent items noted in HPI and remainder of comprehensive ROS otherwise negative.  HOME MEDS: Current Outpatient Medications on File Prior to Visit  Medication Sig Dispense Refill  . ALPRAZolam (XANAX) 0.25 MG tablet Take 1 tablet (0.25 mg total) by mouth at bedtime as needed. 30 tablet 3  . dicyclomine (BENTYL) 20 MG tablet Take 1 tablet (20 mg total) by mouth every 6 (six) hours. 90 tablet 3  . diphenhydrAMINE (BENADRYL) 25 mg capsule Take 25 mg by mouth at bedtime as needed.    Marland Kitchen estradiol (ESTRACE) 1 MG tablet TAKE 1 TABLET (1 MG TOTAL) BY MOUTH DAILY. 30 tablet 0  . glucosamine-chondroitin 500-400 MG tablet Take 2 tablets by mouth daily.    Marland Kitchen levothyroxine (SYNTHROID, LEVOTHROID) 50 MCG tablet Take 1 tablet by mouth daily.    . metoprolol succinate (TOPROL-XL) 25 MG 24 hr tablet Take 1 tablet (25 mg total) by mouth daily. 30 tablet 5  . Multiple Vitamin (MULTIVITAMIN) tablet Take 1 tablet by mouth daily.    Marland Kitchen omeprazole (PRILOSEC) 20 MG capsule Take 1 capsule by mouth daily.    . pravastatin (PRAVACHOL) 10 MG tablet Take 10 mg by mouth daily.       No current facility-administered medications on file prior to visit.     LABS/IMAGING: No results found for this or any previous visit (from the past 48 hour(s)). No results found.  LIPID PANEL: No results found for: CHOL, TRIG, HDL, CHOLHDL, VLDL, LDLCALC, LDLDIRECT  WEIGHTS: Wt Readings from Last 3 Encounters:  01/23/18 184 lb 9.6 oz (83.7 kg)  12/28/17 183 lb (83 kg)  12/20/17 183 lb 6.4 oz (83.2 kg)    VITALS: BP 139/87   Pulse 61   Ht 5\' 2"  (1.575 m)   Wt 184 lb 9.6 oz (83.7 kg)   BMI 33.76 kg/m    EXAM: Deferred  EKG: Deferred  ASSESSMENT: 1. Palpitations-PACs and PVCs -low risk Myoview stress test, LVEF 69% (12/2017) 2. Central chest pressure 3. Snoring and witnessed apnea  PLAN: 1.   Shelia May had a low risk Myoview stress test.  I am concerned about a sleep disorder however she was anxious and did not undergo her scheduled sleep study.  Hopefully she will reconsider this or a home oximetry.  She wants to work on weight loss which I think is helpful either way.  She should  continue her metoprolol for PVCs which did not seem to be ischemic in origin.  Follow-up with me in 6 months or sooner as necessary.  [name], MD, [facility], [place] Director of the Advanced Lipid Disorders &  Cardiovascular Risk Reduction Clinic Diplomate of the American Board of Clinical Lipidology Attending Cardiologist  Direct Dial: [phone]  Fax: [phone]  Website:  [email or web address] 01/23/2018, 9:35 AM

## 2018-01-23 NOTE — Patient Instructions (Signed)
Your physician wants you to follow-up in: 6 months with Dr. Hilty. You will receive a reminder letter in the mail two months in advance. If you don't receive a letter, please call our office to schedule the follow-up appointment.    

## 2018-03-30 DIAGNOSIS — E119 Type 2 diabetes mellitus without complications: Secondary | ICD-10-CM | POA: Insufficient documentation

## 2018-07-03 ENCOUNTER — Telehealth: Payer: Self-pay | Admitting: Internal Medicine

## 2018-07-03 MED ORDER — METOPROLOL SUCCINATE ER 25 MG PO TB24: 25.0000 mg | ORAL_TABLET | Freq: Every day | ORAL | 1 refills | Status: DC

## 2018-07-03 NOTE — Telephone Encounter (Signed)
New message   *STAT* If patient is at the pharmacy, call can be transferred to refill team.   1. Which medications need to be refilled? (please list name of each medication and dose if known) metoprolol succinate (TOPROL-XL) 25 MG 24 hr tablet  2. Which pharmacy/location (including street and city if local pharmacy) is medication to be sent to?[id number] [place], [place] - [address] AT [place]  3. Do they need a 30 day or 90 day supply?[place]

## 2018-08-01 ENCOUNTER — Ambulatory Visit (INDEPENDENT_AMBULATORY_CARE_PROVIDER_SITE_OTHER): Payer: Medicare Other | Admitting: Internal Medicine

## 2018-08-01 ENCOUNTER — Encounter: Payer: Self-pay | Admitting: Internal Medicine

## 2018-08-01 VITALS — BP 130/68 | HR 65 | Ht 62.0 in | Wt 180.0 lb

## 2018-08-01 DIAGNOSIS — E7800 Pure hypercholesterolemia, unspecified: Secondary | ICD-10-CM | POA: Diagnosis not present

## 2018-08-01 DIAGNOSIS — R0602 Shortness of breath: Secondary | ICD-10-CM | POA: Diagnosis not present

## 2018-08-01 DIAGNOSIS — I493 Ventricular premature depolarization: Secondary | ICD-10-CM | POA: Diagnosis not present

## 2018-08-01 NOTE — Patient Instructions (Signed)
Medication Instructions:  Continue current medications If you need a refill on your cardiac medications before your next appointment, please call your pharmacy.   Lab work: NONE If you have labs (blood work) drawn today and your tests are completely normal, you will receive your results only by: . MyChart Message (if you have MyChart) OR . A paper copy in the mail If you have any lab test that is abnormal or we need to change your treatment, we will call you to review the results.  Testing/Procedures: NONE  Follow-Up: At [facility], you and your health needs are our priority.  As part of our continuing mission to provide you with exceptional heart care, we have created designated Provider Care Teams.  These Care Teams include your primary Cardiologist (physician) and Advanced Practice Providers (APPs -  Physician Assistants and Nurse Practitioners) who all work together to provide you with the care you need, when you need it. You will need a follow up appointment in 12 months.  Please call our office 2 months in advance to schedule this appointment.  You may see Dr. Hilty or one of the following Advanced Practice Providers on your designated Care Team: Hao Meng, PA-C . Angela Duke, PA-C  Any Other Special Instructions Will Be Listed Below (If Applicable).    

## 2018-08-01 NOTE — Progress Notes (Signed)
OFFICE CONSULT NOTE  Chief Complaint:  Routine follow-up  Primary Care Physician: Karlene Einstein, MD  HPI:  Shelia May is a 67 y.o. female who is being seen today for the evaluation of chest pressure and palpitations at the request of Haimes, Youlanda Roys, MD.  This is a pleasant 66 year old female who is kindly referred to me for evaluation of chest pressure and palpitations.  She relates back to the night of October 27, 2017.  She was awakened with a feeling of her heart racing.  She was noted to have tachycardia on her Fitbit.  She is felt on and off palpitations since about that time.  She was at their beach house and presented to [facility].  She was noted to have predominantly PACs and some PVCs.  She was prescribed a beta-blocker and notes some mild improvement in her symptoms.  She saw her primary care provider who referred her because she is also been having some chest pressure.  She describes a sensation of a pressure heaviness in the mid central chest between the breasts, not worse with exertion or relieved by rest.  Is not associated with palpitations necessarily.  She thought at first it may be reflux but she has been on reflux treatment for some time.  She also notes that she gets short of breath and her heart rate goes up rapidly over 110-120 beats a minute with minimal exertion.  This apparently is a relatively new finding.  She does have a history of hypothyroidism but appears to be euthyroid on medication.  She takes Xanax, but mentioned it was mostly for sleep.  She does note some difficulty sleeping at night, has been told that she snores by her husband who was present during her office visit and she is found herself gasping for breath.  She occasionally wakes up with headaches in the morning and can feel tired during the day.  She completed the Epworth Sleepiness Scale and her score was 7.  Despite this she is at increased risk for sleep apnea.  Family history significant  for heart attack in her grandfather however no first-degree relatives with heart disease.  She is a non-smoker.  She drinks generally about 1 drink per day.  She is cut this back as well at her caffeine use.  01/23/2018  Shelia May returns today for follow-up of her stress test and monitor.  The stress test showed no ischemia normal LV function with LVEF of 69%.  Her monitor demonstrated sinus rhythm with predominantly PVCs, but no atrial fibrillation, nonsustained VT or other significant arrhythmias.  She reports that she is no longer aware of any palpitations.  We discussed further her sleep abnormalities my concern for a sleep disorder.  She says she has difficulty sleeping at night and did go for sleep study on May 31, however she felt that she could not sleep and became very anxious about the study.  She was then told if she did not complete the study she may have to pay several thousand dollars.  She decided not to go through with it.  We discussed this further today and I explained to her what is actually seen in the study.  We also discussed possibility of doing overnight home oximetry, but that it may lead to a formal sleep study if abnormal.  She says that she would prefer to rather work on weight loss at this time.  08/01/2018  Shelia May is seen today in routine follow-up.  She is done well without any worsening palpitations over the past year.  She is tolerating Toprol without any significant side effects.  She reports her sleeping is somewhat better.  She does not feel fatigued in the morning.  She reports some snoring but not loud snoring or witnessed apnea.  She denies any chest pain or worsening shortness of breath.  PMHx:  Past Medical History:  Diagnosis Date  . Abnormal uterine bleeding   . Adenomyosis   . 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 Bilateral ovarian cysts    RECURRENT   . Endometriosis   . Fibroid   . High cholesterol   . History of DES  exposure in utero   . IBS (irritable bowel syndrome)     Past Surgical History:  Procedure Laterality Date  . DIAGNOSTIC LAP BSO    . OOPHORECTOMY  07/21/2005   DIAGNOSTIC  LAPAROSCOPY WITH LYSIS OF ADHESIONS , BSO  . VAGINAL HYSTERECTOMY  2001   VAGINAL     FAMHx:  Family History  Problem Relation Age of Onset  . Heart disease Maternal Grandfather     SOCHx:   reports that she has never smoked. She has never used smokeless tobacco. She reports that she drinks alcohol. She reports that she does not use drugs.  ALLERGIES:  Allergies  Allergen Reactions  . Morphine And Related Nausea And Vomiting  . Zolpidem Other (See Comments)    Sleep walking.  "Sleep walk" Sleep walking.      ROS: Pertinent items noted in HPI and remainder of comprehensive ROS otherwise negative.  HOME MEDS: Current Outpatient Medications on File Prior to Visit  Medication Sig Dispense Refill  . ALPRAZolam (XANAX) 0.25 MG tablet Take 1 tablet (0.25 mg total) by mouth at bedtime as needed. 30 tablet 3  . dicyclomine (BENTYL) 20 MG tablet Take 1 tablet (20 mg total) by mouth every 6 (six) hours. 90 tablet 3  . diphenhydrAMINE (BENADRYL) 25 mg capsule Take 25 mg by mouth at bedtime as needed.    Marland Kitchen estradiol (ESTRACE) 1 MG tablet TAKE 1 TABLET (1 MG TOTAL) BY MOUTH DAILY. 30 tablet 0  . glucosamine-chondroitin 500-400 MG tablet Take 2 tablets by mouth daily.    Marland Kitchen levothyroxine (SYNTHROID, LEVOTHROID) 50 MCG tablet Take 1 tablet by mouth daily.    . metoprolol succinate (TOPROL-XL) 25 MG 24 hr tablet Take 1 tablet (25 mg total) by mouth daily. 30 tablet 1  . Multiple Vitamin (MULTIVITAMIN) tablet Take 1 tablet by mouth daily.    Marland Kitchen omeprazole (PRILOSEC) 20 MG capsule Take 1 capsule by mouth daily.    . pravastatin (PRAVACHOL) 10 MG tablet Take 10 mg by mouth daily.      . simvastatin (ZOCOR) 20 MG tablet Take 20 mg by mouth daily.     No current facility-administered medications on file prior to visit.      LABS/IMAGING: No results found for this or any previous visit (from the past 48 hour(s)). No results found.  LIPID PANEL: No results found for: CHOL, TRIG, HDL, CHOLHDL, VLDL, LDLCALC, LDLDIRECT  WEIGHTS: Wt Readings from Last 3 Encounters:  08/01/18 180 lb (81.6 kg)  01/23/18 184 lb 9.6 oz (83.7 kg)  12/28/17 183 lb (83 kg)    VITALS: BP 130/68 (BP Location: Right Arm, Patient Position: Sitting, Cuff Size: Normal)   Pulse 65   Ht 5\' 2"  (1.575 m)   Wt 180 lb (81.6 kg)   BMI  32.92 kg/m   EXAM: General appearance: alert and no distress Neck: no carotid bruit, no JVD and thyroid not enlarged, symmetric, no tenderness/mass/nodules Lungs: clear to auscultation bilaterally Heart: regular rate and rhythm, S1, S2 normal, no murmur, click, rub or gallop Abdomen: soft, non-tender; bowel sounds normal; no masses,  no organomegaly Extremities: extremities normal, atraumatic, no cyanosis or edema Pulses: 2+ and symmetric Skin: Skin color, texture, turgor normal. No rashes or lesions Neurologic: Grossly normal Psych: Pleasant  EKG: Normal sinus rhythm 67-personally reviewed  ASSESSMENT: 1. Palpitations-PACs and PVCs -low risk Myoview stress test, LVEF 69% (12/2017) 2. Dyslipidemia  PLAN: 1.   Mrs. Procter denies any worsening PVCs or palpitations.  She is tolerating Toprol.  She has had no further sleep issues.  She did not have a sleep study but denies any witnessed apnea and feels rested during the day.  He was recently switched from pravastatin to simvastatin by her PCP who follows her cholesterol.  No changes made to her medicines today  Follow-up with me annually or sooner as necessary.  [name], MD, [facility], [place] Director of the Advanced Lipid Disorders &  Cardiovascular Risk Reduction Clinic Diplomate of the American Board of Clinical Lipidology Attending Cardiologist  Direct Dial: [phone]  Fax: [phone]    Website:  [email or web address]  08/01/2018, 9:03 AM

## 2018-10-04 ENCOUNTER — Other Ambulatory Visit: Payer: Self-pay | Admitting: Physician Assistant

## 2018-10-04 DIAGNOSIS — M2392 Unspecified internal derangement of left knee: Secondary | ICD-10-CM

## 2018-10-04 DIAGNOSIS — M25562 Pain in left knee: Secondary | ICD-10-CM

## 2018-10-16 ENCOUNTER — Ambulatory Visit: Payer: Medicare Other

## 2018-10-18 ENCOUNTER — Ambulatory Visit
Admission: RE | Admit: 2018-10-18 | Discharge: 2018-10-18 | Disposition: A | Discharge status: Home / Self Care | Payer: Medicare Other | Source: Ambulatory Visit | Attending: Physician Assistant | Admitting: Physician Assistant

## 2018-10-18 DIAGNOSIS — M25562 Pain in left knee: Secondary | ICD-10-CM | POA: Diagnosis present

## 2018-10-18 DIAGNOSIS — M2392 Unspecified internal derangement of left knee: Secondary | ICD-10-CM

## 2018-10-31 ENCOUNTER — Encounter
Admission: RE | Admit: 2018-10-31 | Discharge: 2018-10-31 | Disposition: A | Discharge status: Home / Self Care | Payer: Medicare Other | Source: Ambulatory Visit | Attending: Orthopedic Surgery | Admitting: Orthopedic Surgery

## 2018-10-31 ENCOUNTER — Other Ambulatory Visit: Payer: Self-pay

## 2018-10-31 HISTORY — DX: Atrial premature depolarization: I49.1

## 2018-10-31 HISTORY — DX: Cardiac arrhythmia, unspecified: I49.9

## 2018-10-31 HISTORY — DX: Gastro-esophageal reflux disease without esophagitis: K21.9

## 2018-10-31 HISTORY — DX: Ventricular premature depolarization: I49.3

## 2018-10-31 NOTE — Pre-Procedure Instructions (Signed)
[name], MD  Physician  Cardiology  Progress Notes  Signed  Encounter Date:  08/01/2018          Signed      Expand All Collapse All    Show:Clear all [x] Manual[x] Template[x] Copied  Added by: [x] Hilty, Nadean Corwin, MD  [] Hover for details   OFFICE CONSULT NOTE  Chief Complaint:  Routine follow-up  Primary Care Physician: Shelia Einstein, MD  HPI:  Shelia May is a 68 y.o. female who is being seen today for the evaluation of chest pressure and palpitations at the request of Haimes, Youlanda Roys, MD.  This is a pleasant 68 year old female who is kindly referred to me for evaluation of chest pressure and palpitations.  She relates back to the night of October 27, 2017.  She was awakened with a feeling of her heart racing.  She was noted to have tachycardia on her Fitbit.  She is felt on and off palpitations since about that time.  [place] and presented to [facility].  She was noted to have predominantly PACs and some PVCs.  She was prescribed a beta-blocker and notes some mild improvement in her symptoms.  She saw her primary care provider who referred her because she is also been having some chest pressure.  She describes a sensation of a pressure heaviness in the mid central chest between the breasts, not worse with exertion or relieved by rest.  Is not associated with palpitations necessarily.  She thought at first it may be reflux but she has been on reflux treatment for some time.  She also notes that she gets short of breath and her heart rate goes up rapidly over 110-120 beats a minute with minimal exertion.  This apparently is a relatively new finding.  She does have a history of hypothyroidism but appears to be euthyroid on medication.  She takes Xanax, but mentioned it was mostly for sleep.  She does note some difficulty sleeping at night, has been told that she snores by her husband who was present during her office visit and she is  found herself gasping for breath.  She occasionally wakes up with headaches in the morning and can feel tired during the day.  She completed the Epworth Sleepiness Scale and her score was 7.  Despite this she is at increased risk for sleep apnea.  Family history significant for heart attack in her grandfather however no first-degree relatives with heart disease.  She is a non-smoker.  She drinks generally about 1 drink per day.  She is cut this back as well at her caffeine use.  01/23/2018  Shelia May returns today for follow-up of her stress test and monitor.  The stress test showed no ischemia normal LV function with LVEF of 69%.  Her monitor demonstrated sinus rhythm with predominantly PVCs, but no atrial fibrillation, nonsustained VT or other significant arrhythmias.  She reports that she is no longer aware of any palpitations.  We discussed further her sleep abnormalities my concern for a sleep disorder.  She says she has difficulty sleeping at night and did go for sleep study on May 31, however she felt that she could not sleep and became very anxious about the study.  She was then told if she did not complete the study she may have to pay several thousand dollars.  She decided not to go through with it.  We discussed this further today and I explained to her what is  actually seen in the study.  We also discussed possibility of doing overnight home oximetry, but that it may lead to a formal sleep study if abnormal.  She says that she would prefer to rather work on weight loss at this time.  08/01/2018  Shelia May is seen today in routine follow-up.  She is done well without any worsening palpitations over the past year.  She is tolerating Toprol without any significant side effects.  She reports her sleeping is somewhat better.  She does not feel fatigued in the morning.  She reports some snoring but not loud snoring or witnessed apnea.  She denies any chest pain or worsening shortness of  breath.  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 She has never used smokeless tobacco. She reports that she drinks alcohol. She reports that she does not use drugs.  ALLERGIES:       Allergies  Allergen Reactions   Morphine And Related Nausea And Vomiting   Zolpidem Other (See Comments)    Sleep walking.  "Sleep walk" Sleep walking.      ROS: Pertinent items noted in HPI and remainder of comprehensive ROS otherwise negative.  HOME MEDS:       Current Outpatient Medications on File Prior to Visit  Medication Sig Dispense Refill   ALPRAZolam (XANAX) 0.25 MG tablet Take 1 tablet (0.25 mg total) by mouth at bedtime as needed. 30 tablet 3   dicyclomine (BENTYL) 20 MG tablet Take 1 tablet (20 mg total) by mouth every 6 (six) hours. 90 tablet 3   diphenhydrAMINE (BENADRYL) 25 mg capsule Take 25 mg by mouth at bedtime as needed.     estradiol (ESTRACE) 1 MG tablet TAKE 1 TABLET (1 MG TOTAL) BY MOUTH DAILY. 30 tablet 0   glucosamine-chondroitin 500-400 MG tablet Take 2 tablets by mouth daily.     levothyroxine (SYNTHROID, LEVOTHROID) 50 MCG tablet Take 1 tablet by mouth daily.     metoprolol succinate (TOPROL-XL) 25 MG 24 hr tablet Take 1 tablet (25 mg total) by mouth daily. 30 tablet 1    Multiple Vitamin (MULTIVITAMIN) tablet Take 1 tablet by mouth daily.     omeprazole (PRILOSEC) 20 MG capsule Take 1 capsule by mouth daily.     pravastatin (PRAVACHOL) 10 MG tablet Take 10 mg by mouth daily.       simvastatin (ZOCOR) 20 MG tablet Take 20 mg by mouth daily.     No current facility-administered medications on file prior to visit.     LABS/IMAGING: LabResultsLast48Hours  No results found for this or any previous visit (from the past 48 hour(s)).  ImagingResults(Last48hours)  No results found.    LIPID PANEL: Labs(Brief)  No results found for: CHOL, TRIG, HDL, CHOLHDL, VLDL, LDLCALC, LDLDIRECT    WEIGHTS:    Wt Readings from Last 3 Encounters:  08/01/18 180 lb (81.6 kg)  01/23/18 184 lb 9.6 oz (83.7 kg)  12/28/17 183 lb (83 kg)    VITALS: BP 130/68 (BP Location: Right Arm, Patient Position: Sitting, Cuff Size: Normal)    Pulse 65    Ht 5\' 2"  (1.575 m)    Wt 180 lb (81.6 kg)    BMI 32.92 kg/m   EXAM: General appearance: alert and no distress Neck: no carotid bruit, no JVD and thyroid not enlarged, symmetric, no tenderness/mass/nodules Lungs: clear to auscultation bilaterally Heart: regular rate and rhythm, S1, S2 normal, no murmur, click, rub or gallop Abdomen: soft, non-tender; bowel sounds normal; no masses,  no organomegaly Extremities: extremities normal, atraumatic, no cyanosis or edema Pulses: 2+ and symmetric Skin: Skin color, texture, turgor normal. No rashes or lesions Neurologic: Grossly normal Psych: Pleasant  EKG: Normal sinus rhythm 67-personally reviewed  ASSESSMENT: 1. Palpitations-PACs and PVCs -low risk Myoview stress test, LVEF 69% (12/2017) 2. Dyslipidemia  PLAN: 1.   Shelia May denies any worsening PVCs or palpitations.  She is tolerating Toprol.  She has had no further sleep issues.  She did not have a sleep study but denies any witnessed apnea and feels rested during the day.  He was recently  switched from pravastatin to simvastatin by her PCP who follows her cholesterol.  No changes made to her medicines today  Follow-up with me annually or sooner as necessary.  [name], MD, [facility], [place] Director of the [facility] Lipid Disorders &  Cardiovascular Risk Reduction Clinic Diplomate of the American Board of Clinical Lipidology Attending Cardiologist  Direct Dial: [phone]   Fax: [phone]  Website:  [email or web address]  [name] 08/01/2018, 9:03 AM        Electronically signed by [name], MD at 08/01/2018 9:18 AM     Office Visit on 08/01/2018       Detailed Report

## 2018-10-31 NOTE — Pre-Procedure Instructions (Signed)
[place] SPECT [facility] PERF W/EXERCISE STRESS 1D  Order# [id number]  Reading physician: Lelon Perla, MD Ordering physician: [name], MD Study date: 12/28/17  Patient Information   Name MRN Description  Shelia May [id number] 68 y.o. female  Result Notes for MYOCARDIAL PERFUSION IMAGING   Notes recorded by Fidel Levy, RN on 01/01/2018 at 9:17 AM EDT Patient called w/results ------  Notes recorded by [name], MD on 12/29/2017 at 2:29 PM EDT No ischemia. LVEF 69%. Low risk study.  Dr. Lemmie Evens      Vitals   Height Weight BMI (Calculated)  5\' 2"  (1.575 m) 83.7 kg 33.76  Study Highlights     Nuclear stress EF: 69%.  The left ventricular ejection fraction is hyperdynamic (>65%).  Blood pressure demonstrated a normal response to exercise.  There was no ST segment deviation noted during stress.  The study is normal.  This is a low risk study.   Normal stress nuclear study with no ischemia or infarction; EF 69 with normal wall motion.   Nuclear History and Indications   History and Indications Indication for Stress Test: Diagnosis of coronary disease History: No prior cardiac or respiratory history reported; Recent PVC's and PAC's; No prior NUC MPI for comparison. Cardiac Risk Factors: Family History - CAD, Lipids, Obesity and Hypothyroidism; Leukocytosis; Snoring  Symptoms: Chest Pain, DOE, Palpitations and SOB  Stress Findings   ECG Baseline ECG exhibits normal sinus rhythm..  Stress Findings The patient exercised following the Bruce protocol.  The patient experienced no angina during the stress test.   The test was stopped because the patient complained of fatigue and shortness of breath.   Blood pressure and heart rate demonstrated a normal response to exercise. Blood pressure demonstrated a normal response to exercise. Overall, the patient's exercise capacity was normal.   85% of maximum heart rate was achieved  after 6 minutes. Recovery time: 5 minutes. The patient's response to exercise was adequate for diagnosis.  Response to Stress There was no ST segment deviation noted during stress.  Arrhythmias during stress: rare PVCs.  Arrhythmias during recovery: rare PVCs.  Arrhythmias were not significant.  ECG was interpretable and there was no significant change from baseline.  Stress Measurements   Baseline Vitals  Rest HR 61 bpm    Rest BP 155/99 mmHg    Exercise Time  Exercise duration (min) 9 min    Exercise duration (sec) 0 sec    Peak Stress Vitals  Peak HR 157 bpm    Peak BP 200/81 mmHg    Exercise Data  MPHR 155 bpm    Percent HR 101 %    RPE 17     Estimated workload 10.1 METS       Nuclear Stress Measurements   LV sys vol 21 mL    TID 1.13     LV dias vol 67 mL    SSS 1     SRS 0     SDS 1          Nuclear Stress Findings   Isotope administration Rest isotope was administered with an IV injection of 10.1 mCi Tc4m Tetrofosmin. Rest SPECT images were obtained approximately 45 minutes post tracer injection. Stress isotope was administered with an IV injection of 29.7 mCi Tc1m Tetrofosmin at peak exercise. Images were obtained approximately 30 minutes post injection. Stress SPECT images were obtained approximately 30 minutes post tracer injection.  Nuclear Measurements Study was gated.  Rest Perfusion Rest perfusion normal.  Stress Perfusion Stress perfusion normal.  Overall Study Impression Myocardial perfusion is normal. The study is normal. This is a low risk study. Overall left ventricular systolic function was normal. LV cavity size is normal. Nuclear stress EF: 69%. The left ventricular ejection fraction is hyperdynamic (>65%). There is no prior study for comparison.  From: ACCF/SCAI/STS/AATS/AHA/ASNC/HFSA/SCCT 2012 Appropriate Use Criteria for Coronary Revascularization Focused Update  Wall Scoring   Score Index: 1.000 Percent Normal: 100.0%            The left ventricular wall motion is normal.          Resulted by:   Signed Date/Time  Phone Pager  Lelon Perla 12/28/2017 2:14 PM [phone]   Report approved and finalized on 12/28/2017 1414  Imaging   Imaging Information  Encounter-Level Documents - 12/28/2017:   Scan on 01/09/2018 10:51 AM by Default, Provider, MD  Electronic signature on 12/28/2017 7:05 AM: chmg/nl/sit - Signed  Electronic signature on 12/28/2017 7:04 AM: chmg/nl/sit - Signed      Order-Level Documents - 12/28/2017:   Scan on 12/28/2017 11:45 AM by Tacy Learn E: MPI Study  Scan on 12/28/2017 9:28 AM by Default, Provider, [place]:   There are no hospital account-level documents.  Exam Information   Status Exam Begun  Exam Ended   Final [99] 12/28/2017 7:42 AM 12/28/2017 11:22 AM  External Result Report   External Result Report  Encounter Report   Patient Encounter Report

## 2018-10-31 NOTE — Patient Instructions (Signed)
Your procedure is scheduled on: 11-05-18  Report to Same Day Surgery 2nd floor medical mall [facility] Entrance-take elevator on left to 2nd floor.  Check in with surgery information desk.) To find out your arrival time please call [phone] between 1PM - 3PM on 11-02-18  Remember: Instructions that are not followed completely may result in serious medical risk, up to and including death, or upon the discretion of your surgeon and anesthesiologist your surgery may need to be rescheduled.    _x___ 1. Do not eat food after midnight the night before your procedure. You may drink clear liquids up to 2 hours before you are scheduled to arrive at the hospital for your procedure.  Do not drink clear liquids within 2 hours of your scheduled arrival to the hospital.  Clear liquids include  --Water or Apple juice without pulp  --Clear carbohydrate beverage such as ClearFast or Gatorade  --Black Coffee or Clear Tea (No milk, no creamers, do not add anything to the coffee or Tea   ____Ensure clear carbohydrate drink on the way to the hospital for bariatric patients  ____Ensure clear carbohydrate drink 3 hours before surgery for Dr Dwyane Luo patients if physician instructed.   No gum chewing or hard candies.     __x__ 2. No Alcohol for 24 hours before or after surgery.   __x__3. No Smoking or e-cigarettes for 24 prior to surgery.  Do not use any chewable tobacco products for at least 6 hour prior to surgery   ____  4. Bring all medications with you on the day of surgery if instructed.    __x__ 5. Notify your doctor if there is any change in your medical condition     (cold, fever, infections).    x___6. On the morning of surgery brush your teeth with toothpaste and water.  You may rinse your mouth with mouth wash if you wish.  Do not swallow any toothpaste or mouthwash.   Do not wear jewelry, make-up, hairpins, clips or nail polish.  Do not wear lotions, powders, or perfumes. You may wear  deodorant.  Do not shave 48 hours prior to surgery. Men may shave face and neck.  Do not bring valuables to the hospital.    [facility] is not responsible for any belongings or valuables.               Contacts, dentures or bridgework may not be worn into surgery.  Leave your suitcase in the car. After surgery it may be brought to your room.  For patients admitted to the hospital, discharge time is determined by your                       treatment team.  _  Patients discharged the day of surgery will not be allowed to drive home.  You will need someone to drive you home and stay with you the night of your procedure.    Please read over the following fact sheets that you were given:   [facility] Preparing for Surgery   _x___ TAKE THE FOLLOWING MEDICATION THE MORNING OF SURGERY WITH A SMALL SIP OF WATER. These include:  1. PRILOSEC  2.LEVOTHYROXINE  3.  4.  5.  6.  ____Fleets enema or Magnesium Citrate as directed.   ____ Use CHG Soap or sage wipes as directed on instruction sheet   ____ Use inhalers on the day of surgery and bring to hospital day of surgery  ____  Stop Metformin and Janumet 2 days prior to surgery.    ____ Take 1/2 of usual insulin dose the night before surgery and none on the morning  surgery.   ____ Follow recommendations from Cardiologist, Pulmonologist or PCP regarding  stopping Aspirin, Coumadin, Plavix ,Eliquis, Effient, or Pradaxa, and Pletal.  X____Stop Anti-inflammatories such as Advil, Aleve, Ibuprofen, Motrin, Naproxen, Naprosyn, Goodies powders or aspirin products NOW-OK to take Tylenol    _x___ Stop supplements until after surgery-STOP GLUCOSAMINE-CHONDROITIN NOW-MAY RESUME AFTER SURGERY   ____ Bring C-Pap to the hospital.

## 2018-11-05 ENCOUNTER — Encounter: Payer: Self-pay | Admitting: Orthopedic Surgery

## 2018-11-05 ENCOUNTER — Other Ambulatory Visit: Payer: Self-pay

## 2018-11-05 ENCOUNTER — Ambulatory Visit
Admission: RE | Admit: 2018-11-05 | Discharge: 2018-11-05 | Disposition: A | Discharge status: Home / Self Care | Payer: Medicare Other | Attending: Orthopedic Surgery | Admitting: Orthopedic Surgery

## 2018-11-05 ENCOUNTER — Encounter
Admission: RE | Disposition: A | Discharge status: Home / Self Care | Payer: Self-pay | Source: Home / Self Care | Attending: Orthopedic Surgery

## 2018-11-05 ENCOUNTER — Ambulatory Visit: Payer: Medicare Other | Admitting: Anesthesiology

## 2018-11-05 DIAGNOSIS — Z8349 Family history of other endocrine, nutritional and metabolic diseases: Secondary | ICD-10-CM | POA: Diagnosis not present

## 2018-11-05 DIAGNOSIS — Z79899 Other long term (current) drug therapy: Secondary | ICD-10-CM | POA: Insufficient documentation

## 2018-11-05 DIAGNOSIS — F513 Sleepwalking [somnambulism]: Secondary | ICD-10-CM | POA: Diagnosis not present

## 2018-11-05 DIAGNOSIS — Z800 Family history of malignant neoplasm of digestive organs: Secondary | ICD-10-CM | POA: Diagnosis not present

## 2018-11-05 DIAGNOSIS — Z888 Allergy status to other drugs, medicaments and biological substances status: Secondary | ICD-10-CM | POA: Diagnosis not present

## 2018-11-05 DIAGNOSIS — K219 Gastro-esophageal reflux disease without esophagitis: Secondary | ICD-10-CM | POA: Insufficient documentation

## 2018-11-05 DIAGNOSIS — N809 Endometriosis, unspecified: Secondary | ICD-10-CM | POA: Insufficient documentation

## 2018-11-05 DIAGNOSIS — S83242A Other tear of medial meniscus, current injury, left knee, initial encounter: Secondary | ICD-10-CM | POA: Diagnosis present

## 2018-11-05 DIAGNOSIS — E119 Type 2 diabetes mellitus without complications: Secondary | ICD-10-CM | POA: Diagnosis not present

## 2018-11-05 DIAGNOSIS — E039 Hypothyroidism, unspecified: Secondary | ICD-10-CM | POA: Diagnosis not present

## 2018-11-05 DIAGNOSIS — E785 Hyperlipidemia, unspecified: Secondary | ICD-10-CM | POA: Insufficient documentation

## 2018-11-05 DIAGNOSIS — I491 Atrial premature depolarization: Secondary | ICD-10-CM | POA: Insufficient documentation

## 2018-11-05 DIAGNOSIS — L80 Vitiligo: Secondary | ICD-10-CM | POA: Insufficient documentation

## 2018-11-05 DIAGNOSIS — K589 Irritable bowel syndrome without diarrhea: Secondary | ICD-10-CM | POA: Diagnosis not present

## 2018-11-05 DIAGNOSIS — M94262 Chondromalacia, left knee: Secondary | ICD-10-CM | POA: Insufficient documentation

## 2018-11-05 DIAGNOSIS — Z9889 Other specified postprocedural states: Secondary | ICD-10-CM

## 2018-11-05 DIAGNOSIS — Z90710 Acquired absence of both cervix and uterus: Secondary | ICD-10-CM | POA: Insufficient documentation

## 2018-11-05 DIAGNOSIS — R0602 Shortness of breath: Secondary | ICD-10-CM | POA: Diagnosis not present

## 2018-11-05 DIAGNOSIS — I493 Ventricular premature depolarization: Secondary | ICD-10-CM | POA: Insufficient documentation

## 2018-11-05 DIAGNOSIS — F419 Anxiety disorder, unspecified: Secondary | ICD-10-CM | POA: Insufficient documentation

## 2018-11-05 DIAGNOSIS — S83232A Complex tear of medial meniscus, current injury, left knee, initial encounter: Secondary | ICD-10-CM | POA: Insufficient documentation

## 2018-11-05 HISTORY — PX: KNEE ARTHROSCOPY: SHX127

## 2018-11-05 SURGERY — ARTHROSCOPY, KNEE
Anesthesia: General | Laterality: Left

## 2018-11-05 MED ORDER — ACETAMINOPHEN 10 MG/ML IV SOLN
INTRAVENOUS | Status: AC
  Filled 2018-11-05: qty 100

## 2018-11-05 MED ORDER — ONDANSETRON HCL 4 MG/2ML IJ SOLN: 4.0000 mg | Freq: Four times a day (QID) | INTRAMUSCULAR | Status: DC | PRN

## 2018-11-05 MED ORDER — LACTATED RINGERS IV SOLN
INTRAVENOUS | Status: DC
  Administered 2018-11-05: 10:00:00 via INTRAVENOUS

## 2018-11-05 MED ORDER — PROPOFOL 10 MG/ML IV BOLUS
INTRAVENOUS | Status: DC | PRN
  Administered 2018-11-05: 20 mg via INTRAVENOUS
  Administered 2018-11-05: 180 mg via INTRAVENOUS

## 2018-11-05 MED ORDER — METOCLOPRAMIDE HCL 10 MG PO TABS: 5.0000 mg | ORAL_TABLET | Freq: Three times a day (TID) | ORAL | Status: DC | PRN

## 2018-11-05 MED ORDER — ACETAMINOPHEN 10 MG/ML IV SOLN
INTRAVENOUS | Status: DC | PRN
  Administered 2018-11-05: 1000 mg via INTRAVENOUS

## 2018-11-05 MED ORDER — MIDAZOLAM HCL 2 MG/2ML IJ SOLN
INTRAMUSCULAR | Status: DC | PRN
  Administered 2018-11-05: 2 mg via INTRAVENOUS

## 2018-11-05 MED ORDER — HYDROCODONE-ACETAMINOPHEN 5-325 MG PO TABS: 1.0000 | ORAL_TABLET | ORAL | Status: DC | PRN

## 2018-11-05 MED ORDER — FENTANYL CITRATE (PF) 100 MCG/2ML IJ SOLN: 25.0000 ug | INTRAMUSCULAR | Status: DC | PRN

## 2018-11-05 MED ORDER — SODIUM CHLORIDE 0.9 % IV SOLN: INTRAVENOUS | Status: DC

## 2018-11-05 MED ORDER — MIDAZOLAM HCL 2 MG/2ML IJ SOLN
INTRAMUSCULAR | Status: AC
  Filled 2018-11-05: qty 2

## 2018-11-05 MED ORDER — LIDOCAINE HCL (PF) 2 % IJ SOLN
INTRAMUSCULAR | Status: AC
  Filled 2018-11-05: qty 10

## 2018-11-05 MED ORDER — KETAMINE HCL 50 MG/ML IJ SOLN
INTRAMUSCULAR | Status: DC | PRN
  Administered 2018-11-05: 25 mg via INTRAVENOUS

## 2018-11-05 MED ORDER — ROCURONIUM BROMIDE 100 MG/10ML IV SOLN
INTRAVENOUS | Status: DC | PRN
  Administered 2018-11-05: 50 mg via INTRAVENOUS

## 2018-11-05 MED ORDER — MORPHINE SULFATE 4 MG/ML IJ SOLN
INTRAMUSCULAR | Status: DC | PRN
  Administered 2018-11-05: 4 mg

## 2018-11-05 MED ORDER — HYDROCODONE-ACETAMINOPHEN 5-325 MG PO TABS: 1.0000 | ORAL_TABLET | ORAL | 0 refills | Status: DC | PRN

## 2018-11-05 MED ORDER — BUPIVACAINE-EPINEPHRINE (PF) 0.25% -1:200000 IJ SOLN
INTRAMUSCULAR | Status: AC
  Filled 2018-11-05: qty 30

## 2018-11-05 MED ORDER — PROPOFOL 10 MG/ML IV BOLUS
INTRAVENOUS | Status: AC
  Filled 2018-11-05: qty 20

## 2018-11-05 MED ORDER — ONDANSETRON HCL 4 MG PO TABS: 4.0000 mg | ORAL_TABLET | Freq: Four times a day (QID) | ORAL | Status: DC | PRN

## 2018-11-05 MED ORDER — FENTANYL CITRATE (PF) 100 MCG/2ML IJ SOLN
INTRAMUSCULAR | Status: DC | PRN
  Administered 2018-11-05 (×2): 50 ug via INTRAVENOUS
  Administered 2018-11-05: 100 ug via INTRAVENOUS

## 2018-11-05 MED ORDER — WHITE PETROLATUM EX OINT
TOPICAL_OINTMENT | CUTANEOUS | Status: AC
  Filled 2018-11-05: qty 5

## 2018-11-05 MED ORDER — MORPHINE SULFATE (PF) 4 MG/ML IV SOLN
INTRAVENOUS | Status: AC
  Filled 2018-11-05: qty 1

## 2018-11-05 MED ORDER — METOCLOPRAMIDE HCL 5 MG/ML IJ SOLN: 5.0000 mg | Freq: Three times a day (TID) | INTRAMUSCULAR | Status: DC | PRN

## 2018-11-05 MED ORDER — ROCURONIUM BROMIDE 50 MG/5ML IV SOLN
INTRAVENOUS | Status: AC
  Filled 2018-11-05: qty 1

## 2018-11-05 MED ORDER — FENTANYL CITRATE (PF) 100 MCG/2ML IJ SOLN
INTRAMUSCULAR | Status: AC
  Filled 2018-11-05: qty 2

## 2018-11-05 MED ORDER — CHLORHEXIDINE GLUCONATE 4 % EX LIQD
60.0000 mL | Freq: Once | CUTANEOUS | Status: AC
  Administered 2018-11-05: 1 via TOPICAL

## 2018-11-05 MED ORDER — LIDOCAINE HCL (CARDIAC) PF 100 MG/5ML IV SOSY
PREFILLED_SYRINGE | INTRAVENOUS | Status: DC | PRN
  Administered 2018-11-05: 100 mg via INTRAVENOUS

## 2018-11-05 MED ORDER — SUGAMMADEX SODIUM 200 MG/2ML IV SOLN
INTRAVENOUS | Status: DC | PRN
  Administered 2018-11-05: 200 mg via INTRAVENOUS

## 2018-11-05 MED ORDER — ONDANSETRON HCL 4 MG/2ML IJ SOLN: 4.0000 mg | Freq: Once | INTRAMUSCULAR | Status: DC | PRN

## 2018-11-05 MED ORDER — BUPIVACAINE-EPINEPHRINE 0.25% -1:200000 IJ SOLN
INTRAMUSCULAR | Status: DC | PRN
  Administered 2018-11-05: 5 mL
  Administered 2018-11-05: 25 mL

## 2018-11-05 SURGICAL SUPPLY — 27 items
BLADE SHAVER 4.5 DBL SERAT CV (CUTTER) IMPLANT
CLOSURE WOUND 1/2 X4 (GAUZE/BANDAGES/DRESSINGS) ×1
COVER WAND RF STERILE (DRAPES) ×3 IMPLANT
CUFF TOURN SGL QUICK 24 (TOURNIQUET CUFF) ×3
CUFF TOURN SGL QUICK 30 (TOURNIQUET CUFF)
CUFF TRNQT CYL 24X4X16.5-23 (TOURNIQUET CUFF) IMPLANT
CUFF TRNQT CYL 30X4X21-28X (TOURNIQUET CUFF) IMPLANT
DRSG DERMACEA 8X12 NADH (GAUZE/BANDAGES/DRESSINGS) ×3 IMPLANT
DURAPREP 26ML APPLICATOR (WOUND CARE) ×6 IMPLANT
GAUZE SPONGE 4X4 12PLY STRL (GAUZE/BANDAGES/DRESSINGS) ×3 IMPLANT
GLOVE BIOGEL M STRL SZ7.5 (GLOVE) ×3 IMPLANT
GLOVE INDICATOR 8.0 STRL GRN (GLOVE) ×3 IMPLANT
GOWN STRL REUS W/ TWL LRG LVL3 (GOWN DISPOSABLE) ×2 IMPLANT
GOWN STRL REUS W/TWL LRG LVL3 (GOWN DISPOSABLE) ×6
IV LACTATED RINGER IRRG 3000ML (IV SOLUTION) ×21
IV LR IRRIG 3000ML ARTHROMATIC (IV SOLUTION) ×6 IMPLANT
KIT TURNOVER KIT A (KITS) ×3 IMPLANT
MANIFOLD NEPTUNE II (INSTRUMENTS) ×3 IMPLANT
PACK ARTHROSCOPY KNEE (MISCELLANEOUS) ×3 IMPLANT
SET TUBE SUCT SHAVER OUTFL 24K (TUBING) ×3 IMPLANT
SET TUBE TIP INTRA-ARTICULAR (MISCELLANEOUS) ×3 IMPLANT
STRIP CLOSURE SKIN 1/2X4 (GAUZE/BANDAGES/DRESSINGS) ×1 IMPLANT
SUT ETHILON 3-0 FS-10 30 BLK (SUTURE) ×3
SUTURE EHLN 3-0 FS-10 30 BLK (SUTURE) ×1 IMPLANT
TUBING ARTHRO INFLOW-ONLY STRL (TUBING) ×3 IMPLANT
WAND HAND CNTRL MULTIVAC 50 (MISCELLANEOUS) ×3 IMPLANT
WRAP KNEE W/COLD PACKS 25.5X14 (SOFTGOODS) ×3 IMPLANT

## 2018-11-05 NOTE — Anesthesia Procedure Notes (Signed)
Procedure Name: Intubation Date/Time: 11/05/2018 11:39 AM Performed by: Bernardo Heater, CRNA Pre-anesthesia Checklist: Patient identified, Emergency Drugs available, Suction available and Patient being monitored Patient Re-evaluated:Patient Re-evaluated prior to induction Oxygen Delivery Method: Circle system utilized Preoxygenation: Pre-oxygenation with 100% oxygen Induction Type: IV induction Ventilation: Mask ventilation without difficulty Laryngoscope Size: Mac and 3 Grade View: Grade I Tube type: Oral Tube size: 7.0 mm Number of attempts: 1 Placement Confirmation: ETT inserted through vocal cords under direct vision,  positive ETCO2 and breath sounds checked- equal and bilateral Secured at: 21 cm Tube secured with: Tape Dental Injury: Teeth and Oropharynx as per pre-operative assessment

## 2018-11-05 NOTE — Discharge Instructions (Signed)
AMBULATORY SURGERY  °DISCHARGE INSTRUCTIONS ° ° °1) The drugs that you were given will stay in your system until tomorrow so for the next 24 hours you should not: ° °A) Drive an automobile °B) Make any legal decisions °C) Drink any alcoholic beverage ° ° °2) You may resume regular meals tomorrow.  Today it is better to start with liquids and gradually work up to solid foods. ° °You may eat anything you prefer, but it is better to start with liquids, then soup and crackers, and gradually work up to solid foods. ° ° °3) Please notify your doctor immediately if you have any unusual bleeding, trouble breathing, redness and pain at the surgery site, drainage, fever, or pain not relieved by medication. °4)  ° °5) Your post-operative visit with Dr.                     °           °     is: Date:                        Time:   ° °Please call to schedule your post-operative visit. ° °6) Additional Instructions: ° ° ° ° ° ° ° °Instructions after Knee Arthroscopy  ° ° James P. Hooten, Jr., M.D.    ° Dept. of Orthopaedics & Sports Medicine ° [facility] ° [address] ° [place], [place]  [address] ° ° Phone: [phone]   Fax: [phone] ° ° °DIET: °• Drink plenty of non-alcoholic fluids & begin a light diet. °• Resume your normal diet the day after surgery. ° °ACTIVITY:  °• You may use crutches or a walker with weight-bearing as tolerated, unless instructed otherwise. °• You may wean yourself off of the walker or crutches as tolerated.  °• Begin doing gentle exercises. Exercising will reduce the pain and swelling, increase motion, and prevent muscle weakness.   °• Avoid strenuous activities or athletics for a minimum of 4-6 weeks after arthroscopic surgery. °• Do not drive or operate any equipment until instructed. ° °WOUND CARE:  °• Place one to two pillows under the knee the first day or two when sitting or lying.  °• Continue to use the ice packs periodically to reduce pain and swelling. °• The small incisions in  your knee are closed with nylon stitches. The stitches will be removed in the office. °• The bulky dressing may be removed on the second day after surgery. DO NOT TOUCH THE STITCHES. Put a Band-Aid over each stitch. Do NOT use any ointments or creams on the incisions.  °• You may bathe or shower after the stitches are removed at the first office visit following surgery. ° °MEDICATIONS: °• You may resume your regular medications. °• Please take the pain medication as prescribed. °• Do not take pain medication on an empty stomach. °• Do not drive or drink alcoholic beverages when taking pain medications. ° °CALL THE OFFICE FOR: °• Temperature above 101 degrees °• Excessive bleeding or drainage on the dressing. °• Excessive swelling, coldness, or paleness of the toes. °• Persistent nausea and vomiting. ° °FOLLOW-UP:  °• You should have an appointment to return to the office in 7-10 days after surgery.  °  °

## 2018-11-05 NOTE — Op Note (Signed)
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   ASSISTANT: none  ANESTHESIA: general  ESTIMATED BLOOD LOSS: Minimal  FLUIDS REPLACED: 500 mL of crystalloid  TOURNIQUET TIME: Not used  INDICATIONS FOR SURGERY: Shelia May is a 68 y.o. year old female who has been seen for complaints of left knee pain. MRI demonstrated findings consistent with meniscal pathology. After discussion of the risks and benefits of surgical intervention, the patient expressed understanding of the risks benefits and agree with plans for left knee arthroscopy.   PROCEDURE IN DETAIL: The patient was brought into the operating room and, after adequate general anesthesia was achieved, a tourniquet was applied to the left thigh and the leg was placed in the leg holder. All bony prominences were well padded. The patient's left knee was cleaned and prepped with alcohol and Duraprep and draped in the usual sterile fashion. A "timeout" was performed as per usual protocol. The anticipated portal sites were injected with 0.25% Marcaine with epinephrine. An anterolateral incision was made and a cannula was inserted. A moderate effusion was evacuated and the knee was distended with fluid using the pump. The scope was advanced down the medial gutter into the medial compartment. Under visualization with the scope, an anteromedial portal was created and a hooked probe was inserted. The medial meniscus was visualized and probed.  There was a  complex tear of the posterior horn of the medial meniscus.  The tear was debrided using meniscal punches and a 4.5 mm incisor shaver.  Final contouring was performed using the 50 degree ArthroCare wand.  The remaining rim of meniscus was visualized and probed and felt to be stable.  The articular cartilage was visualized.  There were some localized changes of grade III chondromalacia to the medial femoral condyle.  These areas were debrided and contoured using the ArthroCare wand.  The scope was then advanced into the intercondylar notch. The anterior cruciate ligament was visualized and probed and felt to be intact. The scope was removed from the lateral portal and reinserted via the anteromedial portal to better visualize the lateral compartment. The lateral meniscus was visualized and probed.  The lateral meniscus was intact.  The articular cartilage of the lateral compartment was visualized and noted to be in good condition.  Finally, the scope was advanced so as to visualize the patellofemoral articulation. Good patellar tracking was appreciated.  There were grade 3-4 changes of chondromalacia involving the patellofemoral articulation.  These areas were debrided and contoured using the ArthroCare wand.  The knee was irrigated with copius amounts of fluid and suctioned dry. The anterolateral portal was re-approximated with #3-0 nylon. A combination of 0.25% Marcaine with epinephrine and 4 mg of Morphine were injected via the scope. The scope was removed and the anteromedial portal was re-approximated with #3-0 nylon. A sterile dressing was applied followed by application of an ice wrap.  The patient tolerated the procedure well and was transported to the PACU in stable condition.  James P. Holley Bouche., M.D.

## 2018-11-05 NOTE — Anesthesia Post-op Follow-up Note (Signed)
Anesthesia QCDR form completed.        

## 2018-11-05 NOTE — H&P (Signed)
The patient has been re-examined, and the chart reviewed, and there have been no interval changes to the documented history and physical.    The risks, benefits, and alternatives have been discussed at length. The patient expressed understanding of the risks benefits and agreed with plans for surgical intervention.  Tirth Cothron P. Oskar Cretella, Jr. M.D.    

## 2018-11-05 NOTE — Anesthesia Preprocedure Evaluation (Addendum)
Anesthesia Evaluation  Patient identified by MRN, date of birth, ID band Patient awake    Reviewed: Allergy & Precautions, NPO status , Patient's Chart, lab work & pertinent test results, reviewed documented beta blocker date and time   Airway Mallampati: III       Dental   Pulmonary shortness of breath and with exertion,    Pulmonary exam normal        Cardiovascular Normal cardiovascular exam+ dysrhythmias      Neuro/Psych Anxiety negative neurological ROS  negative psych ROS   GI/Hepatic GERD  ,  Endo/Other  diabetes, Well ControlledHypothyroidism   Renal/GU   Female GU complaint     Musculoskeletal   Abdominal Normal abdominal exam  (+)   Peds negative pediatric ROS (+)  Hematology negative hematology ROS (+)   Anesthesia Other Findings Past Medical History: No date: Abnormal uterine bleeding No date: Adenomyosis No date: 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[place])     Comment:  unknown name causes vitiligo No date: Bilateral ovarian cysts     Comment:  RECURRENT  No date: Dysrhythmia No date: Endometriosis No date: Fibroid No date: GERD (gastroesophageal reflux disease) No date: High cholesterol No date: 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 No date: IBS (irritable bowel syndrome) No date: PAC (premature atrial contraction) No date: PVC (premature ventricular contraction)  Reproductive/Obstetrics                             Anesthesia Physical Anesthesia Plan  ASA: III  Anesthesia Plan: General   Post-op Pain Management:    Induction: Intravenous  PONV Risk Score and Plan:   Airway Management Planned: Oral ETT and LMA  Additional Equipment:   Intra-op Plan:   Post-operative Plan: Extubation in OR  Informed Consent: I have reviewed the patients History and Physical, chart, labs and discussed the procedure including the risks, benefits and alternatives for the proposed anesthesia with  the patient or authorized representative who has indicated his/her understanding and acceptance.     Dental advisory given  Plan Discussed with: CRNA and Surgeon  Anesthesia Plan Comments:        Anesthesia Quick Evaluation

## 2018-11-05 NOTE — Transfer of Care (Signed)
Immediate Anesthesia Transfer of Care Note  Patient: Shelia May  Procedure(s) Performed: ARTHROSCOPY KNEE LEFT (Left )  Patient Location: PACU  Anesthesia Type:General  Level of Consciousness: awake, alert , oriented and patient cooperative  Airway & Oxygen Therapy: Patient Spontanous Breathing and Patient connected to face mask oxygen  Post-op Assessment: Report given to RN and Post -op Vital signs reviewed and stable  Post vital signs: Reviewed and stable  Last Vitals:  Vitals Value Taken Time  BP 156/77 11/05/2018  1:32 PM  Temp 36.7 C 11/05/2018  1:30 PM  Pulse 101 11/05/2018  1:33 PM  Resp 19 11/05/2018  1:33 PM  SpO2 98 % 11/05/2018  1:33 PM  Vitals shown include unvalidated device data.  Last Pain:  Vitals:   11/05/18 1330  TempSrc:   PainSc: Asleep         Complications: No apparent anesthesia complications

## 2018-11-06 ENCOUNTER — Encounter: Payer: Self-pay | Admitting: Orthopedic Surgery

## 2018-11-06 NOTE — Anesthesia Postprocedure Evaluation (Signed)
Anesthesia Post Note  Patient: Shelia May  Procedure(s) Performed: ARTHROSCOPY KNEE LEFT (Left )  Patient location during evaluation: PACU Anesthesia Type: General Level of consciousness: awake and alert and oriented Pain management: pain level controlled Vital Signs Assessment: post-procedure vital signs reviewed and stable Respiratory status: spontaneous breathing Cardiovascular status: blood pressure returned to baseline Anesthetic complications: no     Last Vitals:  Vitals:   11/05/18 1411 11/05/18 1451  BP: (!) 161/77 (!) 167/96  Pulse: 90 90  Resp: 20 16  Temp: 36.7 C   SpO2: 98% 96%    Last Pain:  Vitals:   11/06/18 0826  TempSrc:   PainSc: 2                  Maks Cavallero

## 2018-12-26 ENCOUNTER — Other Ambulatory Visit: Payer: Self-pay | Admitting: Internal Medicine

## 2018-12-26 NOTE — Telephone Encounter (Signed)
Metoprolol Succ 25 mg refilled.

## 2019-06-14 ENCOUNTER — Other Ambulatory Visit: Payer: Self-pay | Admitting: Internal Medicine

## 2019-06-17 ENCOUNTER — Telehealth: Payer: Self-pay | Admitting: Internal Medicine

## 2019-06-17 NOTE — Telephone Encounter (Signed)
The patient has been made aware that the prescription has been sent in for her already. She verbalized her understanding.

## 2019-06-17 NOTE — Telephone Encounter (Signed)
New Message   *STAT* If patient is at the pharmacy, call can be transferred to refill team.   1. Which medications need to be refilled? (please list name of each medication and dose if known) metoprolol succinate (TOPROL-XL) 25 MG 24 hr tablet  2. Which pharmacy/location (including street and city if local pharmacy) is medication to be sent to? [id number] [place]  3. Do they need a 30 day or 90 day supply? 90 day

## 2019-08-07 ENCOUNTER — Other Ambulatory Visit: Payer: Self-pay

## 2019-08-07 ENCOUNTER — Ambulatory Visit (INDEPENDENT_AMBULATORY_CARE_PROVIDER_SITE_OTHER): Payer: Medicare Other | Admitting: Internal Medicine

## 2019-08-07 ENCOUNTER — Encounter: Payer: Self-pay | Admitting: Internal Medicine

## 2019-08-07 VITALS — BP 134/84 | HR 65 | Temp 97.9°F | Ht 61.5 in | Wt 178.0 lb

## 2019-08-07 DIAGNOSIS — E785 Hyperlipidemia, unspecified: Secondary | ICD-10-CM | POA: Diagnosis not present

## 2019-08-07 DIAGNOSIS — I493 Ventricular premature depolarization: Secondary | ICD-10-CM

## 2019-08-07 MED ORDER — METOPROLOL SUCCINATE ER 25 MG PO TB24: 25.0000 mg | ORAL_TABLET | Freq: Every day | ORAL | 3 refills | Status: DC

## 2019-08-07 NOTE — Patient Instructions (Signed)
Medication Instructions:  Your physician recommends that you continue on your current medications as directed. Please refer to the Current Medication list given to you today.  *If you need a refill on your cardiac medications before your next appointment, please call your pharmacy*  Follow-Up: At [facility], you and your health needs are our priority.  As part of our continuing mission to provide you with exceptional heart care, we have created designated Provider Care Teams.  These Care Teams include your primary Cardiologist (physician) and Advanced Practice Providers (APPs -  Physician Assistants and Nurse Practitioners) who all work together to provide you with the care you need, when you need it.  Your next appointment:   12 month(s)  The format for your next appointment:   In Person  Provider:   You may see Dr. Debara Pickett or one of the following Advanced Practice Providers on your designated Care Team:    Almyra Deforest, PA-C  Fabian Sharp, [place] or   Roby Lofts, PA-C   Other Instructions [employer] by Evalee Mutton

## 2019-08-07 NOTE — Progress Notes (Signed)
OFFICE CONSULT NOTE  Chief Complaint:  Routine follow-up  Primary Care Physician: Karlene Einstein, MD  HPI:  Shelia May is a 68 y.o. female who is being seen today for the evaluation of chest pressure and palpitations at the request of Haimes, Youlanda Roys, MD.  This is a pleasant 68 year old female who is kindly referred to me for evaluation of chest pressure and palpitations.  She relates back to the night of October 27, 2017.  She was awakened with a feeling of her heart racing.  She was noted to have tachycardia on her Fitbit.  She is felt on and off palpitations since about that time.  [place] and presented to [facility].  She was noted to have predominantly PACs and some PVCs.  She was prescribed a beta-blocker and notes some mild improvement in her symptoms.  She saw her primary care provider who referred her because she is also been having some chest pressure.  She describes a sensation of a pressure heaviness in the mid central chest between the breasts, not worse with exertion or relieved by rest.  Is not associated with palpitations necessarily.  She thought at first it may be reflux but she has been on reflux treatment for some time.  She also notes that she gets short of breath and her heart rate goes up rapidly over 110-120 beats a minute with minimal exertion.  This apparently is a relatively new finding.  She does have a history of hypothyroidism but appears to be euthyroid on medication.  She takes Xanax, but mentioned it was mostly for sleep.  She does note some difficulty sleeping at night, has been told that she snores by her husband who was present during her office visit and she is found herself gasping for breath.  She occasionally wakes up with headaches in the morning and can feel tired during the day.  She completed the Epworth Sleepiness Scale and her score was 7.  Despite this she is at increased risk for sleep apnea.  Family history significant  for heart attack in her grandfather however no first-degree relatives with heart disease.  She is a non-smoker.  She drinks generally about 1 drink per day.  She is cut this back as well at her caffeine use.  01/23/2018  Shelia May returns today for follow-up of her stress test and monitor.  The stress test showed no ischemia normal LV function with LVEF of 69%.  Her monitor demonstrated sinus rhythm with predominantly PVCs, but no atrial fibrillation, nonsustained VT or other significant arrhythmias.  She reports that she is no longer aware of any palpitations.  We discussed further her sleep abnormalities my concern for a sleep disorder.  She says she has difficulty sleeping at night and did go for sleep study on May 31, however she felt that she could not sleep and became very anxious about the study.  She was then told if she did not complete the study she may have to pay several thousand dollars.  She decided not to go through with it.  We discussed this further today and I explained to her what is actually seen in the study.  We also discussed possibility of doing overnight home oximetry, but that it may lead to a formal sleep study if abnormal.  She says that she would prefer to rather work on weight loss at this time.  08/01/2018  Shelia May is seen today in routine follow-up.  She is done well without any worsening palpitations over the past year.  She is tolerating Toprol without any significant side effects.  She reports her sleeping is somewhat better.  She does not feel fatigued in the morning.  She reports some snoring but not loud snoring or witnessed apnea.  She denies any chest pain or worsening shortness of breath.  08/07/2019  Shelia May seen today for follow-up.  Overall she is doing well.  Unfortunately she had Covid in October.  She did have influenza-like symptoms with cough and shortness of breath as well as some hypoxia.  She was able to manage it at home and recovered from her  fatigue about a month later.  She says her husband who was also at home with her tested negative twice.  She reports her palpitations have improved significantly.  She denies any chest pain.  Blood pressure was initially elevated however came down to 134/84.  PMHx:  Past Medical History:  Diagnosis Date  . Abnormal uterine bleeding   . Adenomyosis   . Autoimmune disorder [facility])    unknown name causes vitiligo  . Bilateral ovarian cysts    RECURRENT   . Dysrhythmia   . Endometriosis   . Fibroid   . GERD (gastroesophageal reflux disease)   . High cholesterol   . 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 IBS (irritable bowel syndrome)   . PAC (premature atrial contraction)   . PVC (premature ventricular contraction)     Past Surgical History:  Procedure Laterality Date  . DIAGNOSTIC LAP BSO    . KNEE ARTHROSCOPY Left 11/05/2018   Procedure: ARTHROSCOPY KNEE LEFT;  Surgeon: Dereck Leep, MD;  Location: [facility] ORS;  Service: Orthopedics;  Laterality: Left;  . OOPHORECTOMY  07/21/2005   DIAGNOSTIC  LAPAROSCOPY WITH LYSIS OF ADHESIONS , BSO  . VAGINAL HYSTERECTOMY  2001   VAGINAL     FAMHx:  Family History  Problem Relation Age of Onset  . Heart disease Maternal Grandfather     SOCHx:   reports that she has never smoked. She has never used smokeless tobacco. She reports current alcohol use. She reports that she does not use drugs.  ALLERGIES:  Allergies  Allergen Reactions  . Morphine And Related Nausea And Vomiting  . Zolpidem Other (See Comments)    Sleep walking.  "Sleep walk" Sleep walking.      ROS: Pertinent items noted in HPI and remainder of comprehensive ROS otherwise negative.  HOME MEDS: Current Outpatient Medications on File Prior to Visit  Medication Sig Dispense Refill  . ALPRAZolam (XANAX) 0.25 MG tablet Take 1 tablet (0.25 mg total) by mouth at bedtime as needed. 30 tablet 3  . dicyclomine (BENTYL) 20 MG tablet Take 1 tablet (20 mg total) by mouth every  6 (six) hours. (Patient taking differently: Take 20 mg by mouth daily as needed for spasms. ) 90 tablet 3  . diphenhydrAMINE (BENADRYL) 25 mg capsule Take 25 mg by mouth at bedtime as needed for allergies.     Marland Kitchen estradiol (ESTRACE) 1 MG tablet TAKE 1 TABLET (1 MG TOTAL) BY MOUTH DAILY. (Patient taking differently: Take 1 mg by mouth daily. ) 30 tablet 0  . glucosamine-chondroitin 500-400 MG tablet Take 2 tablets by mouth daily.    Marland Kitchen HYDROcodone-acetaminophen (NORCO) 5-325 MG tablet Take 1-2 tablets by mouth every 4 (four) hours as needed. 15 tablet 0  . levothyroxine (SYNTHROID, LEVOTHROID) 50 MCG tablet Take 50 mcg by mouth daily  before breakfast.     . metoprolol succinate (TOPROL-XL) 25 MG 24 hr tablet TAKE 1 TABLET BY MOUTH EVERY DAY 90 tablet 0  . Multiple Vitamin (MULTIVITAMIN) tablet Take 1 tablet by mouth daily.    Marland Kitchen omeprazole (PRILOSEC) 20 MG capsule Take 20 mg by mouth every evening.     . simvastatin (ZOCOR) 20 MG tablet Take 20 mg by mouth daily at 6 PM.      No current facility-administered medications on file prior to visit.    LABS/IMAGING: No results found for this or any previous visit (from the past 48 hour(s)). No results found.  LIPID PANEL: No results found for: CHOL, TRIG, HDL, CHOLHDL, VLDL, LDLCALC, LDLDIRECT  WEIGHTS: Wt Readings from Last 3 Encounters:  08/07/19 178 lb (80.7 kg)  11/05/18 183 lb (83 kg)  10/31/18 183 lb (83 kg)    VITALS: BP (!) 156/89   Pulse 65   Temp 97.9 F (36.6 C)   Ht 5' 1.5" (1.562 m)   Wt 178 lb (80.7 kg)   SpO2 100%   BMI 33.09 kg/m   EXAM: General appearance: alert and no distress Neck: no carotid bruit, no JVD and thyroid not enlarged, symmetric, no tenderness/mass/nodules Lungs: clear to auscultation bilaterally Heart: regular rate and rhythm, S1, S2 normal, no murmur, click, rub or gallop Abdomen: soft, non-tender; bowel sounds normal; no masses,  no organomegaly Extremities: extremities normal, atraumatic, no  cyanosis or edema Pulses: 2+ and symmetric Skin: Skin color, texture, turgor normal. No rashes or lesions Neurologic: Grossly normal Psych: Pleasant  EKG: Normal sinus rhythm at 67-personally reviewed  ASSESSMENT: 1. Palpitations-PACs and PVCs -low risk Myoview stress test, LVEF 69% (12/2017) 2. Dyslipidemia  PLAN: 1.   Shelia May continues to do well and denies any recurrent palpitations.  She had low-grade stress testing last year.  Cholesterol is managed by her PCP.  No changes to her medicines today.  Follow-up with me annually or sooner as necessary.  [name], MD, [facility], [place] Director of the [facility]  Card[facility] Diplomate of the American Board of Clinical Lipidology Attending Cardiologist  Direct Dial: [phone]  Fax: [phone]  Website:  [email or web address] May 08/07/2019, 9:38 AM

## 2019-12-25 IMAGING — MR MR KNEE*L* W/O CM
6 series · 40 of 40 positions shown · non-contrast
Comparison: None.

CLINICAL DATA: Medial knee pain and swelling for 4-5 months.

EXAM:
MRI OF THE LEFT KNEE WITHOUT CONTRAST
TECHNIQUE: Multiplanar, multisequence MR imaging of the knee was performed. No
intravenous contrast was administered.

[Series 3: T2 fat-sat · axial · 4.0mm · 0.53mm/px · z∈[-64,+91]mm · 8 of 32 slices shown (1 of 3)]
[im 1/32]
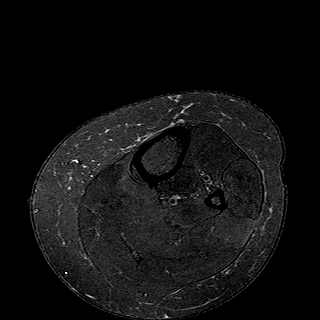
[im 5/32]
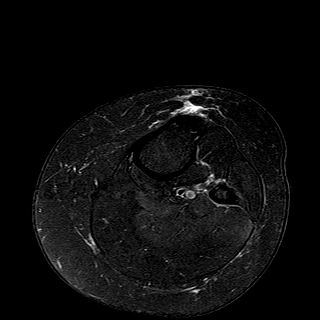
[im 9/32]
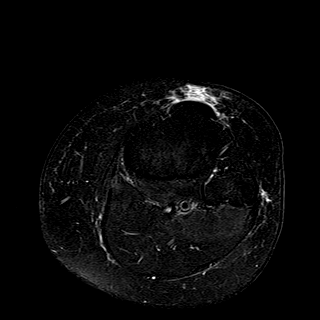
[im 14/32]
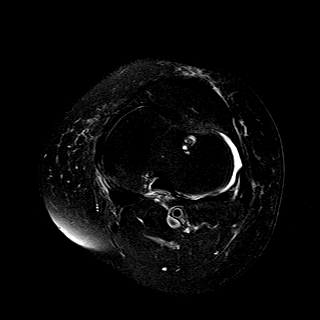
[im 18/32]
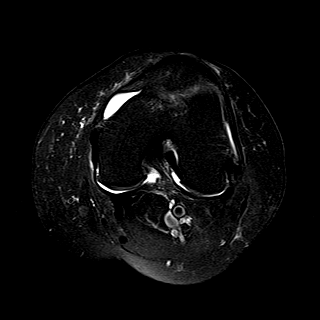
[im 23/32]
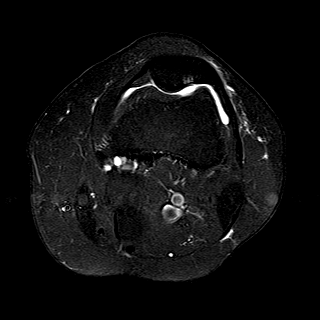
[im 27/32]
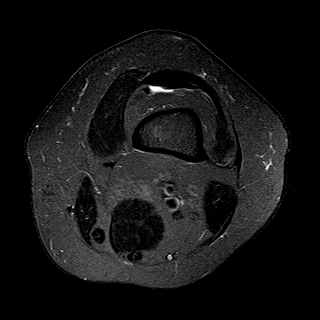
[im 32/32]
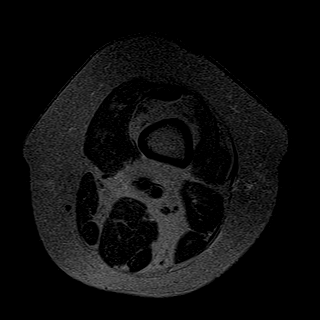

[Series 4: T1 · coronal · 4.0mm · 0.62mm/px · 6 of 28 slices shown]
[im 1/28]
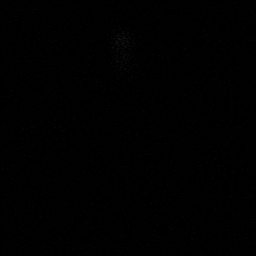
[im 6/28]
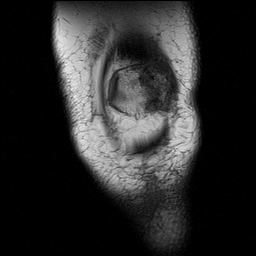
[im 11/28]
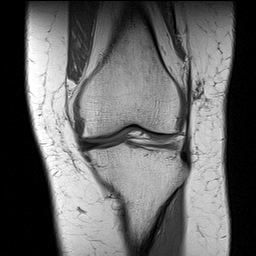
[im 17/28]
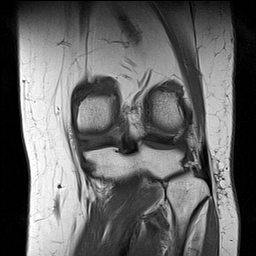
[im 22/28]
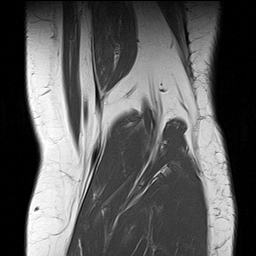
[im 28/28]
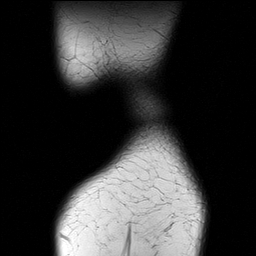

[Series 5: T2 fat-sat · coronal · 4.0mm · 0.62mm/px · 6 of 28 slices shown (2 of 3)]
[im 1/28]
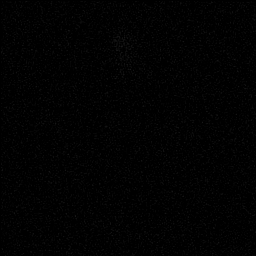
[im 6/28]
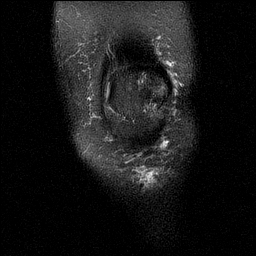
[im 11/28]
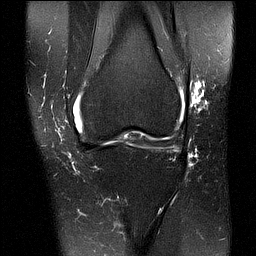
[im 17/28]
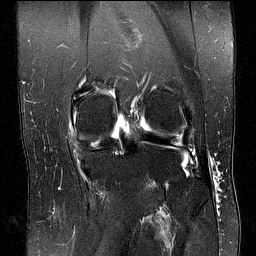
[im 22/28]
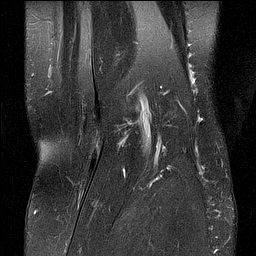
[im 28/28]
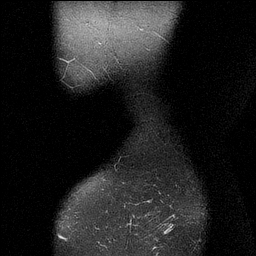

[Series 6: PD fat-sat · coronal · 4.0mm · 0.62mm/px · 6 of 28 slices shown (1 of 2)]
[im 1/28]
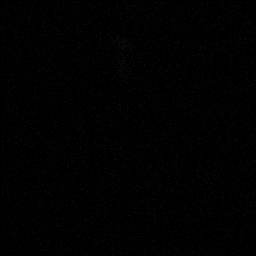
[im 6/28]
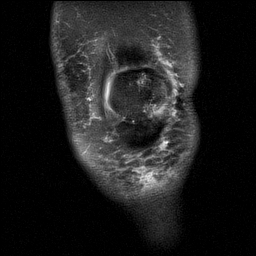
[im 11/28]
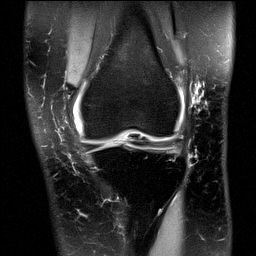
[im 17/28]
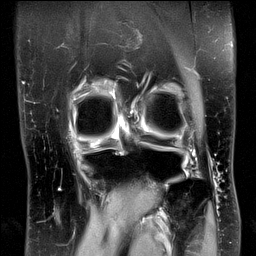
[im 22/28]
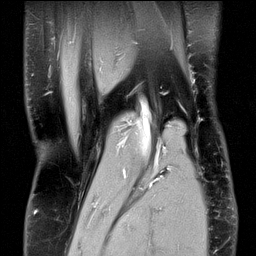
[im 28/28]
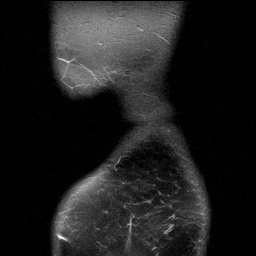

[Series 7: PD fat-sat · sagittal · 3.0mm · 0.62mm/px · 7 of 34 slices shown (2 of 2)]
[im 1/34]
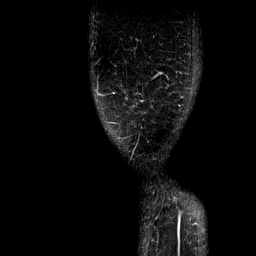
[im 6/34]
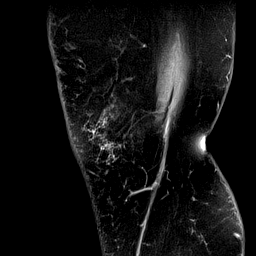
[im 12/34]
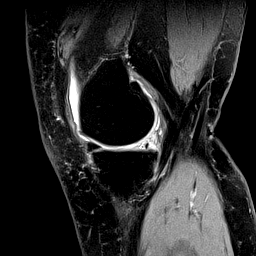
[im 17/34]
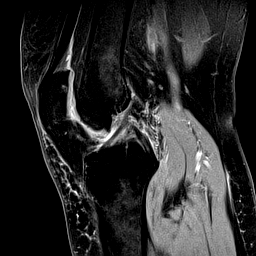
[im 23/34]
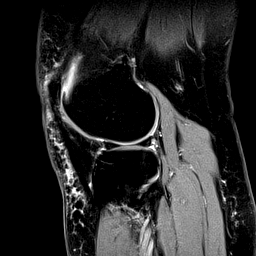
[im 28/34]
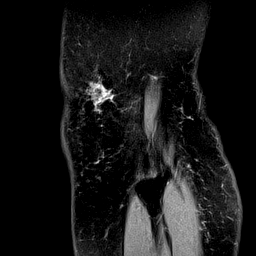
[im 34/34]
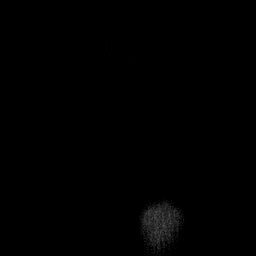

[Series 8: T2 fat-sat · sagittal · 3.0mm · 0.62mm/px · 7 of 34 slices shown (3 of 3)]
[im 1/34]
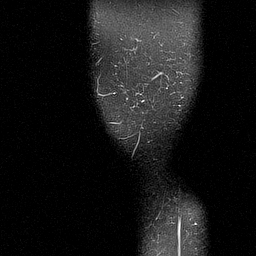
[im 6/34]
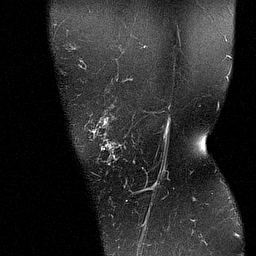
[im 12/34]
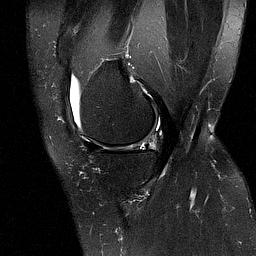
[im 17/34]
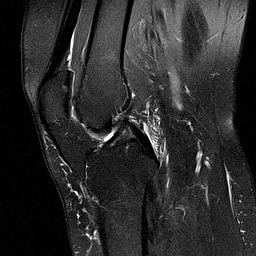
[im 23/34]
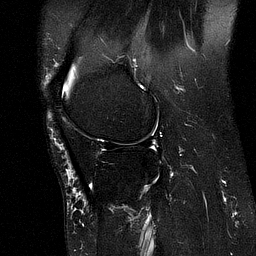
[im 28/34]
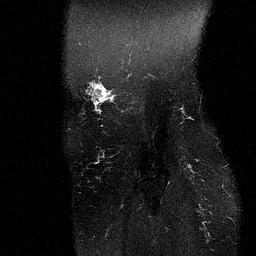
[im 34/34]
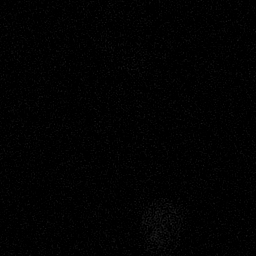

[40 of 40 positions shown; findings below may reference images not displayed]

FINDINGS: MENISCI

Medial meniscus: Radial tear involving the posterior horn with
associated medial protrusion of the meniscus of approximately 5 mm.
Associated typical intrasubstance degenerative change involving the
meniscus which is likely dysfunctional.

Lateral meniscus:  Intact

LIGAMENTS

Cruciates:  Intact.

Collaterals:  Intact.

CARTILAGE

Patellofemoral: Advanced degenerative chondrosis with areas of
full-thickness cartilage loss, joint space narrowing, spurring and
subchondral cystic change most notable along the lateral facet and
lateral aspect of the patellar apex.

Medial: Moderate degenerative chondrosis with early joint space
narrowing and spurring.

Lateral:  Mild degenerative chondrosis with early spurring changes.

Joint:  Small joint effusion.  Superior and medial patellar plica.

Popliteal Fossa:  No popliteal mass or Baker's cyst.

Extensor Mechanism: The patella retinacular structures are intact
and the quadriceps and patellar tendons are intact.

Bones:  No acute bony findings.

Other: Normal knee musculature.
IMPRESSION: 1. Radial tear involving the posterior horn of the medial meniscus
and associated medial protrusion.
2. Intact ligamentous structures and no acute bony findings.
3. Tricompartmental degenerative changes, most severe at the
patellofemoral joint.
4. Small joint effusion.

## 2020-07-15 ENCOUNTER — Other Ambulatory Visit: Payer: Self-pay | Admitting: Internal Medicine

## 2020-09-24 ENCOUNTER — Other Ambulatory Visit: Payer: Self-pay

## 2020-09-24 ENCOUNTER — Encounter: Payer: Self-pay | Admitting: Internal Medicine

## 2020-09-24 ENCOUNTER — Ambulatory Visit (INDEPENDENT_AMBULATORY_CARE_PROVIDER_SITE_OTHER): Payer: Medicare Other | Admitting: Internal Medicine

## 2020-09-24 VITALS — BP 138/70 | HR 60 | Ht 62.0 in | Wt 176.0 lb

## 2020-09-24 DIAGNOSIS — Z79899 Other long term (current) drug therapy: Secondary | ICD-10-CM

## 2020-09-24 DIAGNOSIS — Z01812 Encounter for preprocedural laboratory examination: Secondary | ICD-10-CM | POA: Diagnosis not present

## 2020-09-24 DIAGNOSIS — I639 Cerebral infarction, unspecified: Secondary | ICD-10-CM | POA: Diagnosis not present

## 2020-09-24 DIAGNOSIS — I491 Atrial premature depolarization: Secondary | ICD-10-CM

## 2020-09-24 LAB — CBC
Hemoglobin: 14.4 g/dL (ref 11.1–15.9)
MCH: 29.7 pg (ref 26.6–33.0)

## 2020-09-24 LAB — BASIC METABOLIC PANEL

## 2020-09-24 NOTE — H&P (View-Only) (Signed)
OFFICE CONSULT NOTE  Chief Complaint:  Follow-up stroke  Primary Care Physician: Karlene Einstein, MD  HPI:  Shelia May is a 70 y.o. female who is being seen today for the evaluation of chest pressure and palpitations at the request of Haimes, Shelia Roys, MD.  This is a pleasant 70 year old female who is kindly referred to me for evaluation of chest pressure and palpitations.  She relates back to the night of October 27, 2017.  She was awakened with a feeling of her heart racing.  She was noted to have tachycardia on her Fitbit.  She is felt on and off palpitations since about that time.  She was at [place] and presented to [facility].  She was noted to have predominantly PACs and some PVCs.  She was prescribed a beta-blocker and notes some mild improvement in her symptoms.  She saw her primary care provider who referred her because she is also been having some chest pressure.  She describes a sensation of a pressure heaviness in the mid central chest between the breasts, not worse with exertion or relieved by rest.  Is not associated with palpitations necessarily.  She thought at first it may be reflux but she has been on reflux treatment for some time.  She also notes that she gets short of breath and her heart rate goes up rapidly over 110-120 beats a minute with minimal exertion.  This apparently is a relatively new finding.  She does have a history of hypothyroidism but appears to be euthyroid on medication.  She takes Xanax, but mentioned it was mostly for sleep.  She does note some difficulty sleeping at night, has been told that she snores by her husband who was present during her office visit and she is found herself gasping for breath.  She occasionally wakes up with headaches in the morning and can feel tired during the day.  She completed the Epworth Sleepiness Scale and her score was 7.  Despite this she is at increased risk for sleep apnea.  Family history significant  for heart attack in her grandfather however no first-degree relatives with heart disease.  She is a non-smoker.  She drinks generally about 1 drink per day.  She is cut this back as well at her caffeine use.  01/23/2018  Shelia May returns today for follow-up of her stress test and monitor.  The stress test showed no ischemia normal LV function with LVEF of 69%.  Her monitor demonstrated sinus rhythm with predominantly PVCs, but no atrial fibrillation, nonsustained VT or other significant arrhythmias.  She reports that she is no longer aware of any palpitations.  We discussed further her sleep abnormalities my concern for a sleep disorder.  She says she has difficulty sleeping at night and did go for sleep study on May 31, however she felt that she could not sleep and became very anxious about the study.  She was then told if she did not complete the study she may have to pay several thousand dollars.  She decided not to go through with it.  We discussed this further today and I explained to her what is actually seen in the study.  We also discussed possibility of doing overnight home oximetry, but that it may lead to a formal sleep study if abnormal.  She says that she would prefer to rather work on weight loss at this time.  08/01/2018  Shelia May is seen today in routine follow-up.  She is done well without any worsening palpitations over the past year.  She is tolerating Toprol without any significant side effects.  She reports her sleeping is somewhat better.  She does not feel fatigued in the morning.  She reports some snoring but not loud snoring or witnessed apnea.  She denies any chest pain or worsening shortness of breath.  08/07/2019  Shelia May seen today for follow-up.  Overall she is doing well.  Unfortunately she had Covid in October.  She did have influenza-like symptoms with cough and shortness of breath as well as some hypoxia.  She was able to manage it at home and recovered from her  fatigue about a month later.  She says her husband who was also at home with her tested negative twice.  She reports her palpitations have improved significantly.  She denies any chest pain.  Blood pressure was initially elevated however came down to 134/84.  PMHx:  Past Medical History:  Diagnosis Date  . Abnormal uterine bleeding   . Adenomyosis   . 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 Bilateral ovarian cysts    RECURRENT   . Dysrhythmia   . Endometriosis   . Fibroid   . GERD (gastroesophageal reflux disease)   . High cholesterol   . 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 IBS (irritable bowel syndrome)   . PAC (premature atrial contraction)   . PVC (premature ventricular contraction)     Past Surgical History:  Procedure Laterality Date  . DIAGNOSTIC LAP BSO    . KNEE ARTHROSCOPY Left 11/05/2018   Procedure: ARTHROSCOPY KNEE LEFT;  Surgeon: Dereck Leep, MD;  Location: [facility] ORS;  Service: Orthopedics;  Laterality: Left;  . OOPHORECTOMY  07/21/2005   DIAGNOSTIC  LAPAROSCOPY WITH LYSIS OF ADHESIONS , BSO  . VAGINAL HYSTERECTOMY  2001   VAGINAL     FAMHx:  Family History  Problem Relation Age of Onset  . Heart disease Maternal Grandfather     SOCHx:   reports that she has never smoked. She has never used smokeless tobacco. She reports current alcohol use. She reports that she does not use drugs.  ALLERGIES:  Allergies  Allergen Reactions  . Morphine And Related Nausea And Vomiting  . Zolpidem Other (See Comments)    Sleep walking.  "Sleep walk" Sleep walking.      ROS: Pertinent items noted in HPI and remainder of comprehensive ROS otherwise negative.  HOME MEDS: Current Outpatient Medications on File Prior to Visit  Medication Sig Dispense Refill  . ALPRAZolam (XANAX) 0.25 MG tablet Take 1 tablet (0.25 mg total) by mouth at bedtime as needed. 30 tablet 3  . dicyclomine (BENTYL) 20 MG tablet Take 1 tablet (20 mg total) by mouth every  6 (six) hours. (Patient taking differently: Take 20 mg by mouth daily as needed for spasms.) 90 tablet 3  . diphenhydrAMINE (BENADRYL) 25 mg capsule Take 25 mg by mouth at bedtime as needed for allergies.     Marland Kitchen estradiol (ESTRACE) 1 MG tablet TAKE 1 TABLET (1 MG TOTAL) BY MOUTH DAILY. (Patient taking differently: Take 1 mg by mouth daily.) 30 tablet 0  . glucosamine-chondroitin 500-400 MG tablet Take 2 tablets by mouth daily.    Marland Kitchen HYDROcodone-acetaminophen (NORCO) 5-325 MG tablet Take 1-2 tablets by mouth every 4 (four) hours as needed. 15 tablet 0  . levothyroxine (SYNTHROID, LEVOTHROID) 50 MCG tablet Take 50 mcg by mouth daily before breakfast.     .  metoprolol succinate (TOPROL-XL) 25 MG 24 hr tablet TAKE 1 TABLET BY MOUTH EVERY DAY 90 tablet 0  . Multiple Vitamin (MULTIVITAMIN) tablet Take 1 tablet by mouth daily.    Marland Kitchen omeprazole (PRILOSEC) 20 MG capsule Take 20 mg by mouth every evening.     . simvastatin (ZOCOR) 20 MG tablet Take 20 mg by mouth daily at 6 PM.      No current facility-administered medications on file prior to visit.    LABS/IMAGING: No results found for this or any previous visit (from the past 48 hour(s)). No results found.  LIPID PANEL: No results found for: CHOL, TRIG, HDL, CHOLHDL, VLDL, LDLCALC, LDLDIRECT  WEIGHTS: Wt Readings from Last 3 Encounters:  09/24/20 176 lb (79.8 kg)  08/07/19 178 lb (80.7 kg)  11/05/18 183 lb (83 kg)    VITALS: BP 138/70   Pulse 60   Ht 5\' 2"  (1.575 m)   Wt 176 lb (79.8 kg)   SpO2 98%   BMI 32.19 kg/m   EXAM: General appearance: alert and no distress Neck: no carotid bruit, no JVD and thyroid not enlarged, symmetric, no tenderness/mass/nodules Lungs: clear to auscultation bilaterally Heart: regular rate and rhythm, S1, S2 normal, no murmur, click, rub or gallop Abdomen: soft, non-tender; bowel sounds normal; no masses,  no organomegaly Extremities: extremities normal, atraumatic, no cyanosis or edema Pulses: 2+ and  symmetric Skin: Skin color, texture, turgor normal. No rashes or lesions Neurologic: Grossly normal Psych: Pleasant  EKG: Normal sinus rhythm at 65-personally reviewed  ASSESSMENT: 1. 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 Palpitations-PACs and PVCs -low risk Myoview stress test, LVEF 69% (12/2017) 3. Dyslipidemia  PLAN: 1.   Shelia May was just hospitalized at [facility] with stroke -etiology is unclear. 2 echos show normal LV function, bubble study could not be done due to lack of IV access. I would recommend that we schedule a TEE with bubble. I spoke with Dr. Everette Rank with neurology - he is aware that we are pursuing the TEE and will follow-up on her 2 week zio patch findings.  Follow-up after her TEE.  Addendum:  I was able to obtain the tracing from her monitor.  This showed several episodes of what was categorized as an SVT however it appears to be six or seven beats of an irregular narrow complex tachycardia which may be A. fib.  She also had some PVCs and ventricular bigeminy.  [name], MD, [facility], [place] Director of the Advanced Lipid Disorders &  Cardiovascular Risk Reduction Clinic Diplomate of the American Board of Clinical Lipidology Attending Cardiologist  Direct Dial: [phone]  Fax: [phone]  Website:  [email or web address] 09/24/2020, 11:08 AM

## 2020-09-24 NOTE — Patient Instructions (Signed)
Medication Instructions:  Your physician recommends that you continue on your current medications as directed. Please refer to the Current Medication list given to you today.  *If you need a refill on your cardiac medications before your next appointment, please call your pharmacy*   Lab Work: BMET & CBC about 1 week prior to procedure  If you have labs (blood work) drawn today and your tests are completely normal, you will receive your results only by: Marland Kitchen MyChart Message (if you have MyChart) OR . A paper copy in the mail If you have any lab test that is abnormal or we need to change your treatment, we will call you to review the results.   Testing/Procedures: Transesophageal echocardiogram @ [facility]   Follow-Up: At [facility], you and your health needs are our priority.  As part of our continuing mission to provide you with exceptional heart care, we have created designated Provider Care Teams.  These Care Teams include your primary Cardiologist (physician) and Advanced Practice Providers (APPs -  Physician Assistants and Nurse Practitioners) who all work together to provide you with the care you need, when you need it.  We recommend signing up for the patient portal called "MyChart".  Sign up information is provided on this After Visit Summary.  MyChart is used to connect with patients for Virtual Visits (Telemedicine).  Patients are able to view lab/test results, encounter notes, upcoming appointments, etc.  Non-urgent messages can be sent to your provider as well.   To learn more about what you can do with MyChart, go to [email or web address].    Your next appointment:   Mid-March 2022 - after TEE  The format for your next appointment:   In Person  Provider:   You may see Dr. Debara Pickett or one of the following Advanced Practice Providers on your designated Care Team:    Almyra Deforest, PA-C  Fabian Sharp, [place] or   Roby Lofts, [place]    Other Instructions

## 2020-09-24 NOTE — Progress Notes (Addendum)
OFFICE CONSULT NOTE  Chief Complaint:  Follow-up stroke  Primary Care Physician: Karlene Einstein, MD  HPI:  Shelia May is a 70 y.o. female who is being seen today for the evaluation of chest pressure and palpitations at the request of Haimes, Youlanda Roys, MD.  This is a pleasant 70 year old female who is kindly referred to me for evaluation of chest pressure and palpitations.  She relates back to the night of October 27, 2017.  She was awakened with a feeling of her heart racing.  She was noted to have tachycardia on her Fitbit.  She is felt on and off palpitations since about that time.  She was at their [place] and presented to [facility].  She was noted to have predominantly PACs and some PVCs.  She was prescribed a beta-blocker and notes some mild improvement in her symptoms.  She saw her primary care provider who referred her because she is also been having some chest pressure.  She describes a sensation of a pressure heaviness in the mid central chest between the breasts, not worse with exertion or relieved by rest.  Is not associated with palpitations necessarily.  She thought at first it may be reflux but she has been on reflux treatment for some time.  She also notes that she gets short of breath and her heart rate goes up rapidly over 110-120 beats a minute with minimal exertion.  This apparently is a relatively new finding.  She does have a history of hypothyroidism but appears to be euthyroid on medication.  She takes Xanax, but mentioned it was mostly for sleep.  She does note some difficulty sleeping at night, has been told that she snores by her husband who was present during her office visit and she is found herself gasping for breath.  She occasionally wakes up with headaches in the morning and can feel tired during the day.  She completed the Epworth Sleepiness Scale and her score was 7.  Despite this she is at increased risk for sleep apnea.  Family history significant  for heart attack in her grandfather however no first-degree relatives with heart disease.  She is a non-smoker.  She drinks generally about 1 drink per day.  She is cut this back as well at her caffeine use.  01/23/2018  Shelia May returns today for follow-up of her stress test and monitor.  The stress test showed no ischemia normal LV function with LVEF of 69%.  Her monitor demonstrated sinus rhythm with predominantly PVCs, but no atrial fibrillation, nonsustained VT or other significant arrhythmias.  She reports that she is no longer aware of any palpitations.  We discussed further her sleep abnormalities my concern for a sleep disorder.  She says she has difficulty sleeping at night and did go for sleep study on May 31, however she felt that she could not sleep and became very anxious about the study.  She was then told if she did not complete the study she may have to pay several thousand dollars.  She decided not to go through with it.  We discussed this further today and I explained to her what is actually seen in the study.  We also discussed possibility of doing overnight home oximetry, but that it may lead to a formal sleep study if abnormal.  She says that she would prefer to rather work on weight loss at this time.  08/01/2018  Shelia May is seen today in routine follow-up.  She is done well without any worsening palpitations over the past year.  She is tolerating Toprol without any significant side effects.  She reports her sleeping is somewhat better.  She does not feel fatigued in the morning.  She reports some snoring but not loud snoring or witnessed apnea.  She denies any chest pain or worsening shortness of breath.  08/07/2019  Shelia May seen today for follow-up.  Overall she is doing well.  Unfortunately she had Covid in October.  She did have influenza-like symptoms with cough and shortness of breath as well as some hypoxia.  She was able to manage it at home and recovered from her  fatigue about a month later.  She says her husband who was also at home with her tested negative twice.  She reports her palpitations have improved significantly.  She denies any chest pain.  Blood pressure was initially elevated however came down to 134/84.  PMHx:  Past Medical History:  Diagnosis Date  . Abnormal uterine bleeding   . Adenomyosis   . 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 Bilateral ovarian cysts    RECURRENT   . Dysrhythmia   . Endometriosis   . Fibroid   . GERD (gastroesophageal reflux disease)   . High cholesterol   . 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 IBS (irritable bowel syndrome)   . PAC (premature atrial contraction)   . PVC (premature ventricular contraction)     Past Surgical History:  Procedure Laterality Date  . DIAGNOSTIC LAP BSO    . KNEE ARTHROSCOPY Left 11/05/2018   Procedure: ARTHROSCOPY KNEE LEFT;  Surgeon: Dereck Leep, MD;  Location: [facility] ORS;  Service: Orthopedics;  Laterality: Left;  . OOPHORECTOMY  07/21/2005   DIAGNOSTIC  LAPAROSCOPY WITH LYSIS OF ADHESIONS , BSO  . VAGINAL HYSTERECTOMY  2001   VAGINAL     FAMHx:  Family History  Problem Relation Age of Onset  . Heart disease Maternal Grandfather     SOCHx:   reports that she has never smoked. She has never used smokeless tobacco. She reports current alcohol use. She reports that she does not use drugs.  ALLERGIES:  Allergies  Allergen Reactions  . Morphine And Related Nausea And Vomiting  . Zolpidem Other (See Comments)    Sleep walking.  "Sleep walk" Sleep walking.      ROS: Pertinent items noted in HPI and remainder of comprehensive ROS otherwise negative.  HOME MEDS: Current Outpatient Medications on File Prior to Visit  Medication Sig Dispense Refill  . ALPRAZolam (XANAX) 0.25 MG tablet Take 1 tablet (0.25 mg total) by mouth at bedtime as needed. 30 tablet 3  . dicyclomine (BENTYL) 20 MG tablet Take 1 tablet (20 mg total) by mouth every  6 (six) hours. (Patient taking differently: Take 20 mg by mouth daily as needed for spasms.) 90 tablet 3  . diphenhydrAMINE (BENADRYL) 25 mg capsule Take 25 mg by mouth at bedtime as needed for allergies.     Marland Kitchen estradiol (ESTRACE) 1 MG tablet TAKE 1 TABLET (1 MG TOTAL) BY MOUTH DAILY. (Patient taking differently: Take 1 mg by mouth daily.) 30 tablet 0  . glucosamine-chondroitin 500-400 MG tablet Take 2 tablets by mouth daily.    Marland Kitchen HYDROcodone-acetaminophen (NORCO) 5-325 MG tablet Take 1-2 tablets by mouth every 4 (four) hours as needed. 15 tablet 0  . levothyroxine (SYNTHROID, LEVOTHROID) 50 MCG tablet Take 50 mcg by mouth daily before breakfast.     .  metoprolol succinate (TOPROL-XL) 25 MG 24 hr tablet TAKE 1 TABLET BY MOUTH EVERY DAY 90 tablet 0  . Multiple Vitamin (MULTIVITAMIN) tablet Take 1 tablet by mouth daily.    Marland Kitchen omeprazole (PRILOSEC) 20 MG capsule Take 20 mg by mouth every evening.     . simvastatin (ZOCOR) 20 MG tablet Take 20 mg by mouth daily at 6 PM.      No current facility-administered medications on file prior to visit.    LABS/IMAGING: No results found for this or any previous visit (from the past 48 hour(s)). No results found.  LIPID PANEL: No results found for: CHOL, TRIG, HDL, CHOLHDL, VLDL, LDLCALC, LDLDIRECT  WEIGHTS: Wt Readings from Last 3 Encounters:  09/24/20 176 lb (79.8 kg)  08/07/19 178 lb (80.7 kg)  11/05/18 183 lb (83 kg)    VITALS: BP 138/70   Pulse 60   Ht 5\' 2"  (1.575 m)   Wt 176 lb (79.8 kg)   SpO2 98%   BMI 32.19 kg/m   EXAM: General appearance: alert and no distress Neck: no carotid bruit, no JVD and thyroid not enlarged, symmetric, no tenderness/mass/nodules Lungs: clear to auscultation bilaterally Heart: regular rate and rhythm, S1, S2 normal, no murmur, click, rub or gallop Abdomen: soft, non-tender; bowel sounds normal; no masses,  no organomegaly Extremities: extremities normal, atraumatic, no cyanosis or edema Pulses: 2+ and  symmetric Skin: Skin color, texture, turgor normal. No rashes or lesions Neurologic: Grossly normal Psych: Pleasant  EKG: Normal sinus rhythm at 65-personally reviewed  ASSESSMENT: 1. 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 Palpitations-PACs and PVCs -low risk Myoview stress test, LVEF 69% (12/2017) 3. Dyslipidemia  PLAN: 1.   Shelia May was just hospitalized at [facility] with stroke -etiology is unclear. 2 echos show normal LV function, bubble study could not be done due to lack of IV access. I would recommend that we schedule a TEE with bubble. I spoke with Dr. Everette Rank with neurology - he is aware that we are pursuing the TEE and will follow-up on her 2 week zio patch findings.  Follow-up after her TEE.  Addendum:  I was able to obtain the tracing from her monitor.  This showed several episodes of what was categorized as an SVT however it appears to be six or seven beats of an irregular narrow complex tachycardia which may be A. fib.  She also had some PVCs and ventricular bigeminy.  [name], MD, [facility] [place] Director of the Advanced Lipid Disorders &  Cardiovascular Risk Reduction Clinic Diplomate of the American Board of Clinical Lipidology Attending Cardiologist  Direct Dial: [phone]  Fax: [phone]  Website:  [email or web address] [email or web address] 09/24/2020, 11:08 AM

## 2020-09-25 LAB — CBC
Hematocrit: 43.1 % (ref 34.0–46.6)
MCHC: 33.4 g/dL (ref 31.5–35.7)
MCV: 89 fL (ref 79–97)
Platelets: 246 10*3/uL (ref 150–450)
RBC: 4.85 x10E6/uL (ref 3.77–5.28)
RDW: 12.3 % (ref 11.7–15.4)
WBC: 7.2 10*3/uL (ref 3.4–10.8)

## 2020-09-25 LAB — BASIC METABOLIC PANEL
BUN/Creatinine Ratio: 18 (ref 12–28)
BUN: 16 mg/dL (ref 8–27)
CO2: 23 mmol/L (ref 20–29)
Calcium: 9.9 mg/dL (ref 8.7–10.3)
Chloride: 103 mmol/L (ref 96–106)
Creatinine, Ser: 0.91 mg/dL (ref 0.57–1.00)
GFR calc Af Amer: 76 mL/min/{1.73_m2} (ref >59)
GFR calc non Af Amer: 65 mL/min/{1.73_m2} (ref >59)
Glucose: 81 mg/dL (ref 65–99)
Potassium: 4.7 mmol/L (ref 3.5–5.2)

## 2020-10-08 ENCOUNTER — Other Ambulatory Visit: Payer: Self-pay | Admitting: Internal Medicine

## 2020-10-08 DIAGNOSIS — I639 Cerebral infarction, unspecified: Secondary | ICD-10-CM

## 2020-10-10 ENCOUNTER — Other Ambulatory Visit (HOSPITAL_COMMUNITY)
Admission: RE | Admit: 2020-10-10 | Discharge: 2020-10-10 | Disposition: A | Discharge status: Home / Self Care | Payer: Medicare Other | Source: Ambulatory Visit | Attending: Internal Medicine | Admitting: Internal Medicine

## 2020-10-10 DIAGNOSIS — Z20822 Contact with and (suspected) exposure to covid-19: Secondary | ICD-10-CM | POA: Diagnosis not present

## 2020-10-10 DIAGNOSIS — Z01812 Encounter for preprocedural laboratory examination: Secondary | ICD-10-CM | POA: Diagnosis present

## 2020-10-10 LAB — SARS CORONAVIRUS 2 (TAT 6-24 HRS): SARS Coronavirus 2: NEGATIVE

## 2020-10-13 ENCOUNTER — Other Ambulatory Visit: Payer: Self-pay

## 2020-10-13 ENCOUNTER — Encounter (HOSPITAL_COMMUNITY): Payer: Self-pay | Admitting: Internal Medicine

## 2020-10-13 ENCOUNTER — Ambulatory Visit (HOSPITAL_COMMUNITY)
Admission: RE | Admit: 2020-10-13 | Discharge: 2020-10-13 | Disposition: A | Discharge status: Home / Self Care | Payer: Medicare Other | Attending: Internal Medicine | Admitting: Internal Medicine

## 2020-10-13 ENCOUNTER — Ambulatory Visit (HOSPITAL_COMMUNITY): Payer: Medicare Other | Admitting: Certified Registered Nurse Anesthetist

## 2020-10-13 ENCOUNTER — Ambulatory Visit (HOSPITAL_BASED_OUTPATIENT_CLINIC_OR_DEPARTMENT_OTHER): Payer: Medicare Other

## 2020-10-13 ENCOUNTER — Encounter (HOSPITAL_COMMUNITY)
Admission: RE | Disposition: A | Discharge status: Home / Self Care | Payer: Self-pay | Source: Home / Self Care | Attending: Internal Medicine

## 2020-10-13 DIAGNOSIS — I6389 Other cerebral infarction: Secondary | ICD-10-CM

## 2020-10-13 DIAGNOSIS — R002 Palpitations: Secondary | ICD-10-CM | POA: Diagnosis not present

## 2020-10-13 DIAGNOSIS — Z79890 Hormone replacement therapy (postmenopausal): Secondary | ICD-10-CM | POA: Insufficient documentation

## 2020-10-13 DIAGNOSIS — I639 Cerebral infarction, unspecified: Secondary | ICD-10-CM | POA: Diagnosis not present

## 2020-10-13 DIAGNOSIS — Z885 Allergy status to narcotic agent status: Secondary | ICD-10-CM | POA: Diagnosis not present

## 2020-10-13 DIAGNOSIS — Z79899 Other long term (current) drug therapy: Secondary | ICD-10-CM | POA: Insufficient documentation

## 2020-10-13 DIAGNOSIS — E785 Hyperlipidemia, unspecified: Secondary | ICD-10-CM | POA: Diagnosis not present

## 2020-10-13 HISTORY — PX: TEE WITHOUT CARDIOVERSION: SHX5443

## 2020-10-13 HISTORY — PX: BUBBLE STUDY: SHX6837

## 2020-10-13 SURGERY — ECHOCARDIOGRAM, TRANSESOPHAGEAL
Anesthesia: Monitor Anesthesia Care

## 2020-10-13 MED ORDER — PROPOFOL 500 MG/50ML IV EMUL
INTRAVENOUS | Status: DC | PRN
  Administered 2020-10-13: 150 ug/kg/min via INTRAVENOUS

## 2020-10-13 MED ORDER — BUTAMBEN-TETRACAINE-BENZOCAINE 2-2-14 % EX AERO
INHALATION_SPRAY | CUTANEOUS | Status: DC | PRN
  Administered 2020-10-13: 2 via TOPICAL

## 2020-10-13 MED ORDER — SODIUM CHLORIDE 0.9 % IV SOLN
INTRAVENOUS | Status: DC
  Administered 2020-10-13: 500 mL via INTRAVENOUS

## 2020-10-13 MED ORDER — PROPOFOL 10 MG/ML IV BOLUS
INTRAVENOUS | Status: DC | PRN
  Administered 2020-10-13: 10 mg via INTRAVENOUS
  Administered 2020-10-13: 20 mg via INTRAVENOUS
  Administered 2020-10-13: 30 mg via INTRAVENOUS

## 2020-10-13 NOTE — Discharge Instructions (Signed)

## 2020-10-13 NOTE — Anesthesia Procedure Notes (Signed)
Procedure Name: MAC Date/Time: 10/13/2020 9:01 AM Performed by: Janene Harvey, CRNA Pre-anesthesia Checklist: Patient identified, Emergency Drugs available, Suction available and Patient being monitored Patient Re-evaluated:Patient Re-evaluated prior to induction Oxygen Delivery Method: Nasal cannula Induction Type: IV induction Placement Confirmation: positive ETCO2 Dental Injury: Teeth and Oropharynx as per pre-operative assessment

## 2020-10-13 NOTE — Progress Notes (Signed)
  Echocardiogram Echocardiogram Transesophageal has been performed.  Jennette Dubin 10/13/2020, 9:42 AM

## 2020-10-13 NOTE — CV Procedure (Signed)
TRANSESOPHAGEAL ECHOCARDIOGRAM (TEE) NOTE  INDICATIONS: cryptogenic stroke  PROCEDURE:   Informed consent was obtained prior to the procedure. The risks, benefits and alternatives for the procedure were discussed and the patient comprehended these risks.  Risks include, but are not limited to, cough, sore throat, vomiting, nausea, somnolence, esophageal and stomach trauma or perforation, bleeding, low blood pressure, aspiration, pneumonia, infection, trauma to the teeth and death.    After a procedural time-out, the patient was given propofol per anesthesia for moderate sedation.  The patient's heart rate, blood pressure, and oxygen saturation are monitored continuously during the procedure.The oropharynx was anesthetized with 2 topical cetacaine sprays.  The transesophageal probe was inserted in the esophagus and stomach without difficulty and multiple views were obtained.  The patient was kept under observation until the patient left the procedure room.  I was present face-to-face 100% of this time. The patient left the procedure room in stable condition.   Agitated microbubble saline contrast was administered.  COMPLICATIONS:    There were no immediate complications.  Findings:  1. LEFT VENTRICLE: The left ventricular wall thickness is normal.  The left ventricular cavity is normal in size. Wall motion is normal.  LVEF is 55-60%.  2. RIGHT VENTRICLE:  The right ventricle is normal in structure and function without any thrombus or masses.    3. LEFT ATRIUM:  The left atrium is normal in size without any thrombus or masses.  There is not spontaneous echo contrast ("smoke") in the left atrium consistent with a low flow state.  4. LEFT ATRIAL APPENDAGE:  The left atrial appendage is free of any thrombus or masses. The appendage has single lobes and a chickenwing shape. Pulse doppler indicates moderate flow in the appendage.  5. ATRIAL SEPTUM:  The atrial septum appears intact and is  free of thrombus and/or masses.  There is no evidence for interatrial shunting by color doppler and saline microbubble.  6. RIGHT ATRIUM:  The right atrium is normal in size and function without any thrombus or masses.  7. MITRAL VALVE:  The mitral valve is normal in structure and function with trivial regurgitation.  There were no vegetations or stenosis.  8. AORTIC VALVE:  The aortic valve is trileaflet, normal in structure and function with no regurgitation.  There were no vegetations or stenosis  9. TRICUSPID VALVE:  The tricuspid valve is normal in structure and function with trivial regurgitation.  There were no vegetations or stenosis  10.  PULMONIC VALVE:  The pulmonic valve is normal in structure and function with no regurgitation.  There were no vegetations or stenosis.   11. AORTIC ARCH, ASCENDING AND DESCENDING AORTA:  There was no Ron Parker et. Al, 1992) atherosclerosis of the ascending aorta, aortic arch, or proximal descending aorta.  12. PULMONARY VEINS: Anomalous pulmonary venous return was not noted.  13. PERICARDIUM: The pericardium appeared normal and non-thickened.  There is no pericardial effusion.  IMPRESSION:   1. No cardiac source of embolus 2. No LAA thrombus 3. Negative for PFO 4. No significant valve disease 5. LVEF 55-60% with normal wall motion  RECOMMENDATIONS:    1.  Continue current therapy.   Time Spent Directly with the Patient:  45 minutes   [name], MD, [facility], [place] Director of the Advanced Lipid Disorders &  Cardiovascular Risk Reduction Clinic Diplomate of the American Board of Clinical Lipidology Attending Cardiologist  Direct Dial: [phone]  Fax: [phone]  Website:  [email or web address]  Nadean Corwin Hamzah Savoca 10/13/2020, 9:22 AM

## 2020-10-13 NOTE — Transfer of Care (Signed)
Immediate Anesthesia Transfer of Care Note  Patient: Shelia May  Procedure(s) Performed: TRANSESOPHAGEAL ECHOCARDIOGRAM (TEE) (N/A ) BUBBLE STUDY  Patient Location: Endoscopy Unit  Anesthesia Type:MAC  Level of Consciousness: drowsy and patient cooperative  Airway & Oxygen Therapy: Patient Spontanous Breathing and Patient connected to nasal cannula oxygen  Post-op Assessment: Report given to RN and Post -op Vital signs reviewed and stable  Post vital signs: Reviewed  Last Vitals:  Vitals Value Taken Time  BP 114/59 10/13/20 0923  Temp    Pulse 51 10/13/20 0924  Resp 19 10/13/20 0924  SpO2 94 % 10/13/20 0924  Vitals shown include unvalidated device data.  Last Pain:  Vitals:   10/13/20 0801  TempSrc: Oral  PainSc: 0-No pain         Complications: No complications documented.

## 2020-10-13 NOTE — Interval H&P Note (Signed)
History and Physical Interval Note:  10/13/2020 8:10 AM  Shelia May  has presented today for surgery, with the diagnosis of STROKE.  The various methods of treatment have been discussed with the patient and family. After consideration of risks, benefits and other options for treatment, the patient has consented to  Procedure(s): TRANSESOPHAGEAL ECHOCARDIOGRAM (TEE) (N/A) as a surgical intervention.  The patient's history has been reviewed, patient examined, no change in status, stable for surgery.  I have reviewed the patient's chart and labs.  Questions were answered to the patient's satisfaction.     [name]

## 2020-10-13 NOTE — Anesthesia Preprocedure Evaluation (Addendum)
Anesthesia Evaluation  Patient identified by MRN, date of birth, ID band Patient awake    Reviewed: Allergy & Precautions, NPO status , Patient's Chart, lab work & pertinent test results, reviewed documented beta blocker date and time   Airway Mallampati: II  TM Distance: >3 FB Neck ROM: Full    Dental  (+) Teeth Intact, Caps, Dental Advisory Given   Pulmonary neg pulmonary ROS,    breath sounds clear to auscultation       Cardiovascular + dysrhythmias  Rhythm:Irregular Rate:Abnormal     Neuro/Psych Anxiety negative neurological ROS     GI/Hepatic Neg liver ROS, GERD  Medicated and Controlled,  Endo/Other  diabetesHypothyroidism   Renal/GU negative Renal ROS     Musculoskeletal negative musculoskeletal ROS (+)   Abdominal Normal abdominal exam  (+)   Peds  Hematology negative hematology ROS (+)   Anesthesia Other Findings   Reproductive/Obstetrics                           Anesthesia Physical Anesthesia Plan  ASA: II  Anesthesia Plan: MAC   Post-op Pain Management:    Induction: Intravenous  PONV Risk Score and Plan: 0 and Propofol infusion  Airway Management Planned: Natural Airway and Simple Face Mask  Additional Equipment: None  Intra-op Plan:   Post-operative Plan: Extubation in OR  Informed Consent: I have reviewed the patients History and Physical, chart, labs and discussed the procedure including the risks, benefits and alternatives for the proposed anesthesia with the patient or authorized representative who has indicated his/her understanding and acceptance.       Plan Discussed with: CRNA  Anesthesia Plan Comments:        Anesthesia Quick Evaluation

## 2020-10-13 NOTE — Anesthesia Postprocedure Evaluation (Signed)
Anesthesia Post Note  Patient: Shelia May  Procedure(s) Performed: TRANSESOPHAGEAL ECHOCARDIOGRAM (TEE) (N/A ) BUBBLE STUDY     Patient location during evaluation: PACU Anesthesia Type: MAC Level of consciousness: awake and alert Pain management: pain level controlled Vital Signs Assessment: post-procedure vital signs reviewed and stable Respiratory status: spontaneous breathing, nonlabored ventilation, respiratory function stable and patient connected to nasal cannula oxygen Cardiovascular status: stable and blood pressure returned to baseline Postop Assessment: no apparent nausea or vomiting Anesthetic complications: no   No complications documented.  Last Vitals:  Vitals:   10/13/20 0940 10/13/20 0945  BP: 133/60 134/67  Pulse: 61 (!) 51  Resp: 16 17  Temp:    SpO2: 97% 97%    Last Pain:  Vitals:   10/13/20 0930  TempSrc:   PainSc: 0-No pain                 Effie Berkshire

## 2020-10-14 ENCOUNTER — Encounter (HOSPITAL_COMMUNITY): Payer: Self-pay | Admitting: Internal Medicine

## 2020-10-19 ENCOUNTER — Telehealth: Payer: Self-pay | Admitting: *Deleted

## 2020-10-19 NOTE — Telephone Encounter (Signed)
Notes faxed to surgeon. This phone note will be removed from the preop pool. Richardson Dopp, PA-C  10/19/2020 1:41 PM

## 2020-10-19 NOTE — Telephone Encounter (Signed)
Pt was just seen by Dr. Debara Pickett after admit for cryptogenic CVA. TEE 10/13/2020 without cardiac source of embolus. Zio monitor (per notes) with brief episodes of SVT (suspicious for AFib).  Pt has f/u scheduled 3/25 with Dr. Debara Pickett.  Knee surgery planned 10/21/20.  Will check with Dr. Debara Pickett to see if pt needs to postpone surgery until after f/u or if ok to proceed now.  Richardson Dopp, PA-C    10/19/2020 10:27 AM

## 2020-10-19 NOTE — Telephone Encounter (Signed)
   Primary Cardiologist: No primary care provider on file.  Chart reviewed as part of pre-operative protocol coverage.   Hx:   - Cryptogenic CVA ([place] 08/2020) - Palpitations - Hypothyroidism  - HLD - PACs/PVCs - 09/24/2020: Creatinine, Ser 0.91   Myoview 5/19: no ischemia, EF 69 TEE 10/13/20: EF 55-60, no LA/LAA clot, neg bubble study  Last seen by Dr. Debara Pickett: 09/24/20.  Next OV scheduled: 11/13/20.  RCRI:  Perioperative Risk of Major Cardiac Event is (%): 0.9 (low risk)  Patient was contacted 10/19/2020 in reference to pre-operative risk assessment for pending surgery as outlined below.    Since last seen, Shelia May has done well without chest pain, shortness of breath.  Recommendations: Therefore, based on ACC/AHA guidelines, the patient is at acceptable risk for the planned procedure without further cardiovascular testing.   Please call with questions. Richardson Dopp, PA-C 10/19/2020, 1:11 PM

## 2020-10-19 NOTE — Telephone Encounter (Signed)
Cleared for knee surgery - will see her at regularly scheduled follow-up.  Dr Lemmie Evens

## 2020-10-19 NOTE — Telephone Encounter (Signed)
   [facility] Pre-operative Risk Assessment    HEARTCARE STAFF: - Please ensure there is not already an duplicate clearance open for this procedure. - Under Visit Info/Reason for Call, type in Other and utilize the format Clearance MM/DD/YY or Clearance TBD. Do not use dashes or single digits. - If request is for dental extraction, please clarify the # of teeth to be extracted.  Request for surgical clearance:  1. What type of surgery is being performed? Left knee replacement  2. When is this surgery scheduled? 10/21/20   3. What type of clearance is required (medical clearance vs. Pharmacy clearance to hold med vs. Both)? medical  4. Are there any medications that need to be held prior to surgery and how long?none   5. Practice name and name of physician performing surgery? [facility]   6. What is the office phone number? [phone]   7.   What is the office fax number? [phone]  8.   Anesthesia type (None, local, MAC, general) ? spinal   Shelia May 10/19/2020, 8:54 AM  _________________________________________________________________   (provider comments below)

## 2020-11-13 ENCOUNTER — Ambulatory Visit: Payer: Medicare Other | Admitting: Internal Medicine

## 2020-12-31 ENCOUNTER — Ambulatory Visit: Payer: Medicare Other | Admitting: Internal Medicine

## 2021-02-04 ENCOUNTER — Other Ambulatory Visit: Payer: Self-pay

## 2021-02-04 ENCOUNTER — Encounter: Payer: Self-pay | Admitting: Internal Medicine

## 2021-02-04 ENCOUNTER — Ambulatory Visit (INDEPENDENT_AMBULATORY_CARE_PROVIDER_SITE_OTHER): Payer: Medicare Other | Admitting: Internal Medicine

## 2021-02-04 VITALS — BP 134/80 | HR 63 | Ht 63.0 in | Wt 173.2 lb

## 2021-02-04 DIAGNOSIS — I491 Atrial premature depolarization: Secondary | ICD-10-CM

## 2021-02-04 DIAGNOSIS — I639 Cerebral infarction, unspecified: Secondary | ICD-10-CM

## 2021-02-04 DIAGNOSIS — R002 Palpitations: Secondary | ICD-10-CM | POA: Diagnosis not present

## 2021-02-04 DIAGNOSIS — E785 Hyperlipidemia, unspecified: Secondary | ICD-10-CM

## 2021-02-04 NOTE — Progress Notes (Signed)
OFFICE CONSULT NOTE  Chief Complaint:  Follow-up stroke  Primary Care Physician: Karlene Einstein, MD  HPI:  Shelia May is a 70 y.o. female who is being seen today for the evaluation of chest pressure and palpitations at the request of Haimes, Youlanda Roys, MD.  This is a pleasant 70 year old female who is kindly referred to me for evaluation of chest pressure and palpitations.  She relates back to the night of October 27, 2017.  She was awakened with a feeling of her heart racing.  She was noted to have tachycardia on her Fitbit.  She is felt on and off palpitations since about that time.  She was at [place] and presented to [facility].  She was noted to have predominantly PACs and some PVCs.  She was prescribed a beta-blocker and notes some mild improvement in her symptoms.  She saw her primary care provider who referred her because she is also been having some chest pressure.  She describes a sensation of a pressure heaviness in the mid central chest between the breasts, not worse with exertion or relieved by rest.  Is not associated with palpitations necessarily.  She thought at first it may be reflux but she has been on reflux treatment for some time.  She also notes that she gets short of breath and her heart rate goes up rapidly over 110-120 beats a minute with minimal exertion.  This apparently is a relatively new finding.  She does have a history of hypothyroidism but appears to be euthyroid on medication.  She takes Xanax, but mentioned it was mostly for sleep.  She does note some difficulty sleeping at night, has been told that she snores by her husband who was present during her office visit and she is found herself gasping for breath.  She occasionally wakes up with headaches in the morning and can feel tired during the day.  She completed the Epworth Sleepiness Scale and her score was 7.  Despite this she is at increased risk for sleep apnea.  Family history significant  for heart attack in her grandfather however no first-degree relatives with heart disease.  She is a non-smoker.  She drinks generally about 1 drink per day.  She is cut this back as well at her caffeine use.  01/23/2018  Shelia May returns today for follow-up of her stress test and monitor.  The stress test showed no ischemia normal LV function with LVEF of 69%.  Her monitor demonstrated sinus rhythm with predominantly PVCs, but no atrial fibrillation, nonsustained VT or other significant arrhythmias.  She reports that she is no longer aware of any palpitations.  We discussed further her sleep abnormalities my concern for a sleep disorder.  She says she has difficulty sleeping at night and did go for sleep study on May 31, however she felt that she could not sleep and became very anxious about the study.  She was then told if she did not complete the study she may have to pay several thousand dollars.  She decided not to go through with it.  We discussed this further today and I explained to her what is actually seen in the study.  We also discussed possibility of doing overnight home oximetry, but that it may lead to a formal sleep study if abnormal.  She says that she would prefer to rather work on weight loss at this time.  08/01/2018  Shelia May is seen today in routine follow-up.  She is done well without any worsening palpitations over the past year.  She is tolerating Toprol without any significant side effects.  She reports her sleeping is somewhat better.  She does not feel fatigued in the morning.  She reports some snoring but not loud snoring or witnessed apnea.  She denies any chest pain or worsening shortness of breath.  08/07/2019  Shelia May seen today for follow-up.  Overall she is doing well.  Unfortunately she had Covid in October.  She did have influenza-like symptoms with cough and shortness of breath as well as some hypoxia.  She was able to manage it at home and recovered from her  fatigue about a month later.  She says her husband who was also at home with her tested negative twice.  She reports her palpitations have improved significantly.  She denies any chest pain.  Blood pressure was initially elevated however came down to 134/84.  02/05/2019  Shelia May returns today for follow-up.  She has had no further TIA or stroke.  She is followed up with her neurologist.  She maintains on aspirin.  She had monitoring which showed SVT and possible A. fib however it was brief.  She denies any recurrent symptoms.  Her TEE, as reported demonstrated normal systolic function with no evidence of a PFO or intracardiac thrombus.  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    IBS (irritable bowel syndrome)    PAC (premature atrial contraction)    PVC (premature ventricular contraction)     Past Surgical History:  Procedure Laterality Date   BUBBLE STUDY  10/13/2020   Procedure: BUBBLE STUDY;  Surgeon: [name], MD;  Location: [facility];  Service: Cardiovascular;;   DIAGNOSTIC LAP BSO     KNEE ARTHROSCOPY Left 11/05/2018   Procedure: ARTHROSCOPY KNEE LEFT;  Surgeon: Dereck Leep, MD;  Location: [facility];  Service: Orthopedics;  Laterality: Left;   OOPHORECTOMY  07/21/2005   DIAGNOSTIC  LAPAROSCOPY WITH LYSIS OF ADHESIONS , BSO   TEE WITHOUT CARDIOVERSION N/A 10/13/2020   Procedure: TRANSESOPHAGEAL ECHOCARDIOGRAM (TEE);  Surgeon: [name], MD;  Location[facility] ENDOSCOPY;  Service: Cardiovascular;  Laterality: N/A;   VAGINAL HYSTERECTOMY  2001   VAGINAL     FAMHx:  Family History  Problem Relation Age of Onset   Heart disease Maternal Grandfather     SOCHx:   reports  that she has never smoked. She has never used smokeless tobacco. She reports current alcohol use. She reports that she does not use drugs.  ALLERGIES:  Allergies  Allergen Reactions   Morphine And Related Nausea And Vomiting   Zolpidem Other (See Comments)    Sleep walking.  "Sleep walk" Sleep walking.      ROS: Pertinent items noted in HPI and remainder of comprehensive ROS otherwise negative.  HOME MEDS: Current Outpatient Medications on File Prior to Visit  Medication Sig Dispense Refill   acetaminophen (TYLENOL) 325 MG tablet Take 325 mg by mouth as needed.     aspirin EC 81 MG tablet Take 81 mg by mouth daily. Swallow whole.  citalopram (CELEXA) 20 MG tablet Take 20 mg by mouth daily.     levothyroxine (SYNTHROID, LEVOTHROID) 50 MCG tablet Take 50 mcg by mouth daily before breakfast.      metoprolol succinate (TOPROL-XL) 25 MG 24 hr tablet TAKE 1 TABLET BY MOUTH EVERY DAY 90 tablet 2   Multiple Vitamin (MULTIVITAMIN) tablet Take 1 tablet by mouth daily.     omeprazole (PRILOSEC) 20 MG capsule Take 20 mg by mouth every evening.      pravastatin (PRAVACHOL) 80 MG tablet Take 80 mg by mouth daily.     psyllium (METAMUCIL) 58.6 % packet Take by mouth daily.     ALPRAZolam (XANAX) 0.25 MG tablet Take 1 tablet (0.25 mg total) by mouth at bedtime as needed. (Patient not taking: Reported on 02/04/2021) 30 tablet 3   cholecalciferol (VITAMIN D3) 25 MCG (1000 UNIT) tablet Take 1,000 Units by mouth daily. (Patient not taking: Reported on 02/04/2021)     Omega-3 Fatty Acids (FISH OIL) 1000 MG CAPS Take 1,000 mg by mouth daily. (Patient not taking: Reported on 02/04/2021)     No current facility-administered medications on file prior to visit.    LABS/IMAGING: No results found for this or any previous visit (from the past 48 hour(s)). No results found.  LIPID PANEL: No results found for: CHOL, TRIG, HDL, CHOLHDL, VLDL, LDLCALC, LDLDIRECT  WEIGHTS: Wt Readings from Last 3  Encounters:  02/04/21 173 lb 3.2 oz (78.6 kg)  10/13/20 173 lb (78.5 kg)  09/24/20 176 lb (79.8 kg)    VITALS: BP 134/80 (BP Location: Left Arm, Patient Position: Sitting, Cuff Size: Normal)   Pulse 63   Ht 5\' 3"  (1.6 m)   Wt 173 lb 3.2 oz (78.6 kg)   SpO2 97%   BMI 30.68 kg/m   EXAM: General appearance: alert and no distress Neck: no carotid bruit, no JVD and thyroid not enlarged, symmetric, no tenderness/mass/nodules Lungs: clear to auscultation bilaterally Heart: regular rate and rhythm, S1, S2 normal, no murmur, click, rub or gallop Abdomen: soft, non-tender; bowel sounds normal; no masses,  no organomegaly Extremities: extremities normal, atraumatic, no cyanosis or edema Pulses: 2+ and symmetric Skin: Skin color, texture, turgor normal. No rashes or lesions Neurologic: Grossly normal Psych: Pleasant  EKG: Normal sinus rhythm 63, low voltage QRS-personally reviewed  ASSESSMENT: Cryptogenic stroke (08/2020) Palpitations-PACs and PVCs -low risk Myoview stress test, LVEF 69% (12/2017) Dyslipidemia  PLAN: 1.   Shelia May has had no further TIA or stroke symptoms.  Her TEE was negative for PFO with bubble study and no evidence of cardiac thrombus.  Monitor did not show any convincing atrial fibrillation.  She is on aspirin and statin.  Overall she seems to be doing well.  We will plan follow-up annually or sooner as necessary  [name], MD, [facility], [place] Director of the Advanced Lipid Disorders &  Cardiovascular Risk Reduction Clinic Diplomate of the American Board of Clinical Lipidology Attending Cardiologist  Direct Dial: [phone]  Fax: [phone]  Website:  [email or web address] 02/04/2021, 11:10 AM

## 2021-02-04 NOTE — Patient Instructions (Signed)

## 2021-06-25 ENCOUNTER — Other Ambulatory Visit: Payer: Self-pay | Admitting: Internal Medicine

## 2022-03-29 ENCOUNTER — Other Ambulatory Visit: Payer: Self-pay | Admitting: Internal Medicine

## 2022-06-23 ENCOUNTER — Ambulatory Visit: Payer: Medicare Other | Admitting: Physician Assistant

## 2022-07-27 NOTE — Progress Notes (Deleted)
Cardiology Office Note:    Date:  07/27/2022   ID:  Shelia May, DOB 12-28-1950, MRN [id number]  PCP:  Karlene Einstein, MD   [facility] Providers Cardiologist:  None { Click to update primary MD,subspecialty MD or APP then REFRESH:1}    Referring MD: Karlene Einstein, MD   No chief complaint on file. ***  History of Present Illness:    Shelia May is a 71 y.o. 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    IBS (irritable bowel syndrome)    PAC (premature atrial contraction)    PVC (premature ventricular contraction)     Past Surgical History:  Procedure Laterality Date   BUBBLE STUDY  10/13/2020   Procedure: BUBBLE STUDY;  Surgeon: [name], MD;  Location: [facility];  Service: Cardiovascular;;   DIAGNOSTIC LAP BSO     KNEE ARTHROSCOPY Left 11/05/2018   Procedure: ARTHROSCOPY KNEE LEFT;  Surgeon: Dereck Leep, MD;  Location: [facility];  Service: Orthopedics;  Laterality: Left;   OOPHORECTOMY  07/21/2005   DIAGNOSTIC  LAPAROSCOPY WITH LYSIS OF ADHESIONS , BSO   TEE WITHOUT CARDIOVERSION N/A 10/13/2020   Procedure: TRANSESOPHAGEAL ECHOCARDIOGRAM (TEE);  Surgeon: [name], MD;  Location: [facility];  Service: Cardiovascular;  Laterality: N/A;   VAGINAL HYSTERECTOMY  2001   VAGINAL     Current Medications: No outpatient medications have been marked as taking for the 08/03/22 encounter (Appointment) with Ledora Bottcher, [place].     Allergies:   Morphine and related and Zolpidem   Social History   Socioeconomic History   Marital status: Married    Spouse name: Not on file   Number of children: Not on file    Years of education: Not on file   Highest education level: Not on file  Occupational History   Not on file  Tobacco Use   Smoking status: Never   Smokeless tobacco: Never  Vaping Use   Vaping Use: Never used  Substance and Sexual Activity   Alcohol use: Yes    Comment: 1-2 WINE DAILY   Drug use: No   Sexual activity: Yes    Birth control/protection: Surgical  Other Topics Concern   Not on file  Social History Narrative   Not on file   Social Determinants of Health   Financial Resource Strain: Not on file  Food Insecurity: Not on file  Transportation Needs: Not on file  Physical Activity: Not on file  Stress: Not on file  Social Connections: Not on file     Family History: The patient's ***family history includes Heart disease in her maternal grandfather.  ROS:   Please see the history of present illness.    *** All other systems reviewed and are negative.  EKGs/Labs/Other Studies Reviewed:    The following studies were reviewed today: ***  EKG:  EKG is *** ordered today.  The ekg ordered today demonstrates ***  Recent Labs: No results found for requested labs within last 365 days.  Recent Lipid Panel No results found for: "CHOL", "TRIG", "HDL", "CHOLHDL", "  VLDL", "LDLCALC", "LDLDIRECT"   Risk Assessment/Calculations:   {Does this patient have ATRIAL FIBRILLATION?[phone]}  No BP recorded.  {Refresh Note OR Click here to enter BP  :1}***         Physical Exam:    VS:  There were no vitals taken for this visit.    Wt Readings from Last 3 Encounters:  02/04/21 173 lb 3.2 oz (78.6 kg)  10/13/20 173 lb (78.5 kg)  09/24/20 176 lb (79.8 kg)     GEN: *** Well nourished, well developed in no acute distress HEENT: Normal NECK: No JVD; No carotid bruits LYMPHATICS: No lymphadenopathy CARDIAC: ***RRR, no murmurs, rubs, gallops RESPIRATORY:  Clear to auscultation without rales, wheezing or rhonchi  ABDOMEN: Soft, non-tender,  non-distended MUSCULOSKELETAL:  No edema; No deformity  SKIN: Warm and dry NEUROLOGIC:  Alert and oriented x 3 PSYCHIATRIC:  Normal affect   ASSESSMENT:    No diagnosis found. PLAN:    In order of problems listed above:  ***      {Are you ordering a CV Procedure (e.g. stress test, cath, DCCV, TEE, etc)?   Press F2        :[id number]}    Medication Adjustments/Labs and Tests Ordered: Current medicines are reviewed at length with the patient today.  Concerns regarding medicines are outlined above.  No orders of the defined types were placed in this encounter.  No orders of the defined types were placed in this encounter.   There are no Patient Instructions on file for this visit.   Signed, Ledora Bottcher, [place]  07/27/2022 9:48 AM    [place]

## 2022-07-31 NOTE — Progress Notes (Unsigned)
Cardiology Clinic Note   Patient Name: Shelia May Date of Encounter: 08/03/2022  Primary Care Provider:  Karlene Einstein, MD Primary Cardiologist:  [name], MD  Patient Profile    Shelia May is a 71 y.o. female with a past medical history of palpitations/PVCs/PACs, CVA, OSA, hyperlipidemia  who presents to the clinic today for 12 month follow-up of chronic cardiac conditions.   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    IBS (irritable bowel syndrome)    PAC (premature atrial contraction)    PVC (premature ventricular contraction)    Past Surgical History:  Procedure Laterality Date   BUBBLE STUDY  10/13/2020   Procedure: BUBBLE STUDY;  Surgeon: [name], MD;  Location: [place];  Service: Cardiovascular;;   DIAGNOSTIC LAP BSO     KNEE ARTHROSCOPY Left 11/05/2018   Procedure: ARTHROSCOPY KNEE LEFT;  Surgeon: Dereck Leep, MD;  Location: [facility];  Service: Orthopedics;  Laterality: Left;   OOPHORECTOMY  07/21/2005   DIAGNOSTIC  LAPAROSCOPY WITH LYSIS OF ADHESIONS , BSO   TEE WITHOUT CARDIOVERSION N/A 10/13/2020   Procedure: TRANSESOPHAGEAL ECHOCARDIOGRAM (TEE);  Surgeon: [name], MD;  Location: [facility];  Service: Cardiovascular;  Laterality: N/A;   VAGINAL HYSTERECTOMY  2001   VAGINAL     Allergies  Allergies  Allergen Reactions   Morphine And Related Nausea And Vomiting   Zolpidem Other (See Comments)    Sleep walking.  "Sleep walk" Sleep walking.      History of Present Illness    Shelia May has a past medical history of: Palpitations.  Telemetry monitor 12/28/2017: Predominately  sinus rhythm with PVCs. No afib, SVT, NSVT noted.  ZIO event monitor 1/192022: Predominant underlying rhythm sinus rhythm. 23 SVT runs fastest 7 beats max rate 190 bpm, longest 16 beats avg 101 bpm. Ventricular bigeminy and trigeminy present.  CVA.  MRI Brain 08/01/2020 performed at [facility]: [facility] left frontal acute/subacute infarct with no hemorrhage, midline shift, ventriculomegaly, extra-axial fluid collection. No mass lesion.  TEE 10/13/2020: EF 55-60%. No LA/LAA thrombus. Trivial MR. Negative bubble study.  OSA.  Hypertension.  Hyperlipidemia.  Lipid panel. 07/13/2022: LDL 106, HDL 42, TG 127, total 164.   Shelia May was first referred to Dr. Debara Pickett in May 2019 for evaluation of chest pressure and palpations at the request of PCP. Patient was last seen in the office by Dr. Debara Pickett for follow-up after having a stroke. She had monitoring which showed SVT and possible afib but it was brief. TEE was done and bubble study was negative. Patient did not complain of recurrent TIA/stroke symptoms. She is followed by neurology.   Today, patient is alone. She has been under an increased amount of stress secondary to her mom being in a nursing home and not doing well. She has been keeping a BP per her PCP with 6 readings throughout the day for the last couple of weeks. Readings consistently >130/80 sometimes as high as >160/90. She denies headaches, dizziness, or vision changes. She does not follow a low  sodium diet and eats canned/processed foods. She tries to get enough steps in (tracked with a Fitbit) to equal 2 miles or more but does not do a dedicated exercise routine. Patient denies shortness of breath or dyspnea on exertion. No chest pain, pressure, or tightness. Denies lower extremity edema, orthopnea, or PND. No palpitations - well controlled with Metoprolol.     Home Medications    Current Meds  Medication Sig   ALPRAZolam (XANAX) 0.25 MG tablet Take 1 tablet (0.25 mg  total) by mouth at bedtime as needed.   aspirin EC 81 MG tablet Take 81 mg by mouth daily. Swallow whole.   levothyroxine (SYNTHROID, LEVOTHROID) 50 MCG tablet Take 50 mcg by mouth daily before breakfast.    metoprolol succinate (TOPROL-XL) 25 MG 24 hr tablet TAKE 1 TABLET BY MOUTH EVERY DAY   Multiple Vitamin (MULTIVITAMIN) tablet Take 1 tablet by mouth daily.   omeprazole (PRILOSEC) 20 MG capsule Take 20 mg by mouth every evening.    pravastatin (PRAVACHOL) 80 MG tablet Take 80 mg by mouth daily.    Family History    Family History  Problem Relation Age of Onset   Heart disease Maternal Grandfather    She indicated that the status of her maternal grandfather is unknown.   Social History    Social History   Socioeconomic History   Marital status: Married    Spouse name: Not on file   Number of children: Not on file   Years of education: Not on file   Highest education level: Not on file  Occupational History   Not on file  Tobacco Use   Smoking status: Never   Smokeless tobacco: Never  Vaping Use   Vaping Use: Never used  Substance and Sexual Activity   Alcohol use: Yes    Comment: 1-2 WINE DAILY   Drug use: No   Sexual activity: Yes    Birth control/protection: Surgical  Other Topics Concern   Not on file  Social History Narrative   Not on file   Social Determinants of Health   Financial Resource Strain: Not on file  Food Insecurity: Not on file  Transportation Needs: Not on file  Physical Activity: Not on file  Stress: Not on file  Social Connections: Not on file  Intimate Partner Violence: Not on file     Review of Systems    General:  No chills, fever, night sweats or weight changes.  Cardiovascular:  No chest pain, dyspnea on exertion, edema, orthopnea, palpitations, paroxysmal nocturnal dyspnea. Dermatological: No rash, lesions/masses Respiratory: No cough, dyspnea Urologic: No hematuria, dysuria Abdominal:   No nausea, vomiting, diarrhea, bright  red blood per rectum, melena, or hematemesis Neurologic:  No visual changes, weakness, changes in mental status. All other systems reviewed and are otherwise negative except as noted above.  Physical Exam    VS:  BP 134/82   Pulse 62   Ht '5\' 2"'$  (1.575 m)   Wt 181 lb 9.6 oz (82.4 kg)   SpO2 96%   BMI 33.22 kg/m  , BMI Body mass index is 33.22 kg/m. GEN:  Well nourished, well developed, in no acute distress. HEENT: Normal. Neck: Supple, no JVD, carotid bruits, or masses. Cardiac: RRR, no murmurs, rubs, or gallops. No clubbing, cyanosis, edema.  Radials/DP/PT 2+ and equal bilaterally.  Respiratory:  Respirations regular and unlabored, clear to auscultation bilaterally. GI: Soft, nontender, nondistended. MS: No deformity or atrophy. Skin: Warm and dry, no rash. Neuro: Strength and sensation  are intact. Psych: Normal affect.  Accessory Clinical Findings   Recent Labs: Labs from outside facility 07/13/2022: Potassium 4.4, BUN 14, CRT 0.89, ALT 19, AST 21, HGB 13.7  Recent Lipid Panel Lipid panel from outside facility 07/13/2022: LDL 106, HDL 42, TG 127, total 164.     ECG personally reviewed by me today: NSR, HR 62  No significant changes from 02/04/2021.   Assessment & Plan   Palpitations. Event monitor January 2022 predominate underlying sinus rhythm. 23 SVT runs. Ventricle bigeminy and trigeminy present. Patient reports no further palpitations. Continue metoprolol.  Hypertension. BP today 134/82. She brings in a BP log that shows BP consisently >130/80 throughout the day. She denies dizziness, headaches, or vision changes. Will add amlodipine 5 mg daily. She will continue to keep a BP log with one reading per day 2 hours after taking medication. She will call if BP continues to stay >130/80.  Hyperlipidemia. LDL 07/13/2022 106, not at goal. Patient will stop Pravachol and being rosuvastatin 20 mg. She is in agreement with this plan. Will repeat Lipid panel and LFTs at next visit.   CVA. History of CVA December 2021. No symptoms of TIA/stroke. Continue to follow-up with neurology.        Disposition: Stop Pravachol and being Crestor 20 mg. Add amlodipine 5 mg. Keep BP log, call with readings >130/80. Return in 10-12 weeks.    Shelia May. Shelia Rufus, NP-C     08/03/2022, 10:29 AM [place] [address] Office [phone] Fax [phone]   I spent 10 minutes examining this patient, reviewing medications, and using patient centered shared decision making involving her cardiac care.  Prior to her visit I spent greater than 20 minutes reviewing her past medical history,  medications, and prior cardiac tests.

## 2022-08-03 ENCOUNTER — Ambulatory Visit: Payer: Medicare Other | Attending: Physician Assistant | Admitting: Student

## 2022-08-03 ENCOUNTER — Encounter: Payer: Self-pay | Admitting: Physician Assistant

## 2022-08-03 VITALS — BP 134/82 | HR 62 | Ht 62.0 in | Wt 181.6 lb

## 2022-08-03 DIAGNOSIS — E785 Hyperlipidemia, unspecified: Secondary | ICD-10-CM | POA: Diagnosis not present

## 2022-08-03 DIAGNOSIS — R002 Palpitations: Secondary | ICD-10-CM

## 2022-08-03 DIAGNOSIS — I10 Essential (primary) hypertension: Secondary | ICD-10-CM

## 2022-08-03 DIAGNOSIS — Z8673 Personal history of transient ischemic attack (TIA), and cerebral infarction without residual deficits: Secondary | ICD-10-CM

## 2022-08-03 MED ORDER — ROSUVASTATIN CALCIUM 20 MG PO TABS: 20.0000 mg | ORAL_TABLET | Freq: Every day | ORAL | 3 refills | Status: AC

## 2022-08-03 MED ORDER — AMLODIPINE BESYLATE 5 MG PO TABS: 5.0000 mg | ORAL_TABLET | Freq: Every day | ORAL | 2 refills | Status: DC

## 2022-08-03 NOTE — Patient Instructions (Signed)
Medication Instructions:  Stop Pravachol. Start Crestor 20 mg (Take 1 Tablet Daily). Start Amlodipine 5 mg ( Take 1 Tablet Daily). *If you need a refill on your cardiac medications before your next appointment, please call your pharmacy*   Lab Work: No labs If you have labs (blood work) drawn today and your tests are completely normal, you will receive your results only by: [place] (if you have MyChart) OR A paper copy in the mail If you have any lab test that is abnormal or we need to change your treatment, we will call you to review the results.   Testing/Procedures: No Testing   Follow-Up: At [facility], you and your health needs are our priority.  As part of our continuing mission to provide you with exceptional heart care, we have created designated Provider Care Teams.  These Care Teams include your primary Cardiologist (physician) and Advanced Practice Providers (APPs -  Physician Assistants and Nurse Practitioners) who all work together to provide you with the care you need, when you need it.  We recommend signing up for the patient portal called "MyChart".  Sign up information is provided on this After Visit Summary.  MyChart is used to connect with patients for Virtual Visits (Telemedicine).  Patients are able to view lab/test results, encounter notes, upcoming appointments, etc.  Non-urgent messages can be sent to your provider as well.   To learn more about what you can do with MyChart, go to [email or web address].    Your next appointment:   2-3 month(s)  The format for your next appointment:   In Person  Provider:   Mayra Reel, NP  Other Instructions Monitor Blood Pressure if Blood Pressure above 130/70 consistently Call office.  Important Information About Sugar

## 2022-10-25 ENCOUNTER — Other Ambulatory Visit: Payer: Self-pay | Admitting: Student

## 2022-11-01 NOTE — Progress Notes (Unsigned)
Cardiology Clinic Note   Date: 11/02/2022 ID: [name], [name] March 12, 1951, MRN [id number]  Primary Cardiologist:  [name], MD  Patient Profile    Shelia May is a 72 y.o. female who presents to the clinic today for 69-monthfollow-up.  Past medical history significant for: Palpitations.  Telemetry monitor 5/9/May: Predominately sinus rhythm with PVCs. No afib, SVT, NSVT noted.  ZIO event monitor 1/192022: Predominant underlying rhythm sinus rhythm. 23 SVT runs fastest 7 beats max rate 190 bpm, longest 16 beats avg 101 bpm. Ventricular bigeminy and trigeminy present.  CVA.  [facility] acute/subacute infarct with no hemorrhage, midline shift, ventriculomegaly, extra-axial fluid collection. No mass lesion.  TEE 10/13/2020: EF 55-60%. No LA/LAA thrombus. Trivial MR. Negative bubble study.  OSA.  Hypertension.  Hyperlipidemia.  Lipid panel. 07/13/2022: LDL 106, HDL 42, TG 127, total 164.   History of Present Illness    Shelia May for evaluation of chest pressure and palpations at the request of PCP. Patient was last seen in the office by Dr. HDebara Pickettfor follow-up after having a stroke. She had monitoring which showed SVT and possible afib but it was brief. TEE was done and bubble study was negative. Patient did not complain of recurrent TIA/stroke symptoms. She is followed by neurology.  Patient was last seen in the office by me on 08/03/2022.  At that time patient reported BP consistently >130/80 and sometimes as high as >160/90.  She reported dietary indiscretion eating canned/processed foods.  Amlodipine was added.  Pravachol was changed to Crestor.  Today, patient reports improvement in blood pressure.  She does not have BP log with her but reports BP has normalized.  She denies headaches, dizziness, vision changes.  She had 1  doctor's visit where her BP was around 140/80 but she had not taken her amlodipine that day.  She is doing well. Patient denies shortness of breath or dyspnea on exertion. No chest pain, pressure, or tightness. Denies lower extremity edema, orthopnea, or PND. No palpitations.  She is under tremendous amount of stress secondary to her mom now living in assisted living being very angry toward her.  She reports some depression surrounding the situation.  Encourage patient to speak to PCP regarding her current antidepressant.   ROS: All other systems reviewed and are otherwise negative except as noted in History of Present Illness.  Studies Reviewed    ECG is not ordered today.  Physical Exam    VS:  BP 110/66   Pulse 61   Ht '5\' 2"'$  (1.575 m)   Wt 182 lb 6.4 oz (82.7 kg)   SpO2 93%   BMI 33.36 kg/m  , BMI Body mass index is 33.36 kg/m.  GEN: Well nourished, well developed, in no acute distress. Neck: No JVD or carotid bruits. Cardiac:  RRR. No murmurs. No rubs or gallops.   Respiratory:  Respirations regular and unlabored. Clear to auscultation without rales, wheezing or rhonchi. GI: Soft, nontender, nondistended. Extremities: Radials/DP/PT 2+ and equal bilaterally. No clubbing or cyanosis. No edema.  Skin: Warm and dry, no rash. Neuro: Strength intact.  Assessment & Plan   Hypertension. BP today 110/66.  No BP log today but reports BP has normalized.  1 reading of 140/80 at a doctor's visit however she had forgotten to take amlodipine.  She denies dizziness, headaches, or vision changes.  Continue  amlodipine and metoprolol. Palpitations. Event monitor January 2022 predominate underlying sinus rhythm. 23 SVT runs. Ventricle bigeminy and trigeminy present. Patient reports no further palpitations.  Regular rate and rhythm on auscultation today.  Continue metoprolol.  Hyperlipidemia. LDL 07/13/2022 106, not at goal.  Patient was changed to Crestor at last visit.  Will get repeat lipid panel  and LFTs.  Patient is not fasting today she will return to the office on another day to have labs drawn. CVA. History of CVA December 2021. No symptoms of TIA/stroke. Continue to follow-up with neurology.   Disposition: Lipid panel and LFTs, patient will return when fasting.  Follow-up in 6 months or sooner as needed.         Signed, Shelia May. Shelia Lebon, DNP, NP-C

## 2022-11-02 ENCOUNTER — Encounter: Payer: Self-pay | Admitting: Student

## 2022-11-02 ENCOUNTER — Ambulatory Visit: Payer: Medicare Other | Attending: Student | Admitting: Student

## 2022-11-02 VITALS — BP 110/66 | HR 61 | Ht 62.0 in | Wt 182.4 lb

## 2022-11-02 DIAGNOSIS — E782 Mixed hyperlipidemia: Secondary | ICD-10-CM | POA: Insufficient documentation

## 2022-11-02 DIAGNOSIS — R002 Palpitations: Secondary | ICD-10-CM | POA: Insufficient documentation

## 2022-11-02 DIAGNOSIS — I10 Essential (primary) hypertension: Secondary | ICD-10-CM | POA: Insufficient documentation

## 2022-11-02 NOTE — Patient Instructions (Signed)
Medication Instructions:  Your physician recommends that you continue on your current medications as directed. Please refer to the Current Medication list given to you today.  *If you need a refill on your cardiac medications before your next appointment, please call your pharmacy*   Lab Work: Your physician recommends that you return as soon as possible to have the following labs drawn: Lipids and LFTs If you have labs (blood work) drawn today and your tests are completely normal, you will receive your results only by: [place] (if you have MyChart) OR A paper copy in the mail If you have any lab test that is abnormal or we need to change your treatment, we will call you to review the results.   Testing/Procedures: NONE   Follow-Up: At [facility], you and your health needs are our priority.  As part of our continuing mission to provide you with exceptional heart care, we have created designated Provider Care Teams.  These Care Teams include your primary Cardiologist (physician) and Advanced Practice Providers (APPs -  Physician Assistants and Nurse Practitioners) who all work together to provide you with the care you need, when you need it.  We recommend signing up for the patient portal called "MyChart".  Sign up information is provided on this After Visit Summary.  MyChart is used to connect with patients for Virtual Visits (Telemedicine).  Patients are able to view lab/test results, encounter notes, upcoming appointments, etc.  Non-urgent messages can be sent to your provider as well.   To learn more about what you can do with MyChart, go to [email or web address].    Your next appointment:   6 month(s)  Provider:   [name], MD

## 2022-11-15 ENCOUNTER — Other Ambulatory Visit: Payer: Self-pay

## 2022-11-15 DIAGNOSIS — E782 Mixed hyperlipidemia: Secondary | ICD-10-CM

## 2022-11-16 LAB — HEPATIC FUNCTION PANEL
ALT: 22 IU/L (ref 0–32)
AST: 23 IU/L (ref 0–40)
Albumin: 4.2 g/dL (ref 3.9–4.9)
Alkaline Phosphatase: 110 IU/L (ref 44–121)
Bilirubin Total: 0.3 mg/dL (ref 0.0–1.2)
Bilirubin, Direct: <0.1 mg/dL (ref 0.00–0.40)
Total Protein: 7 g/dL (ref 6.0–8.5)

## 2022-11-16 LAB — LIPID PANEL
Chol/HDL Ratio: 3.3 ratio (ref 0.0–4.4)
Cholesterol, Total: 160 mg/dL (ref 100–199)
HDL: 49 mg/dL (ref >39)
LDL Chol Calc (NIH): 90 mg/dL (ref 0–99)
Triglycerides: 117 mg/dL (ref 0–149)
VLDL Cholesterol Cal: 21 mg/dL (ref 5–40)

## 2022-12-27 ENCOUNTER — Other Ambulatory Visit: Payer: Self-pay | Admitting: Internal Medicine

## 2023-05-05 ENCOUNTER — Ambulatory Visit: Payer: Medicare Other | Admitting: Internal Medicine

## 2023-08-03 ENCOUNTER — Ambulatory Visit: Payer: Medicare Other | Attending: Internal Medicine | Admitting: Internal Medicine

## 2023-08-03 VITALS — BP 132/74 | HR 58 | Ht 62.0 in | Wt 187.0 lb

## 2023-08-03 DIAGNOSIS — E785 Hyperlipidemia, unspecified: Secondary | ICD-10-CM | POA: Insufficient documentation

## 2023-08-03 DIAGNOSIS — I493 Ventricular premature depolarization: Secondary | ICD-10-CM | POA: Insufficient documentation

## 2023-08-03 DIAGNOSIS — I10 Essential (primary) hypertension: Secondary | ICD-10-CM | POA: Diagnosis present

## 2023-08-03 DIAGNOSIS — Z8673 Personal history of transient ischemic attack (TIA), and cerebral infarction without residual deficits: Secondary | ICD-10-CM | POA: Insufficient documentation

## 2023-08-03 NOTE — Progress Notes (Signed)
OFFICE CONSULT NOTE  Chief Complaint:  Follow-up stroke  Primary Care Physician: Jolene Provost, MD  HPI:  Bellarose Signore is a 72 y.o. female who is being seen today for the evaluation of chest pressure and palpitations at the request of Haimes, Teena Irani, MD.  This is a pleasant 72 year old female who is kindly referred to me for evaluation of chest pressure and palpitations.  She relates back to the night of October 27, 2017.  She was awakened with a feeling of her heart racing.  She was noted to have tachycardia on her Fitbit.  She is felt on and off palpitations since about that time.  [place] and presented to [facility].  She was noted to have predominantly PACs and some PVCs.  She was prescribed a beta-blocker and notes some mild improvement in her symptoms.  She saw her primary care provider who referred her because she is also been having some chest pressure.  She describes a sensation of a pressure heaviness in the mid central chest between the breasts, not worse with exertion or relieved by rest.  Is not associated with palpitations necessarily.  She thought at first it may be reflux but she has been on reflux treatment for some time.  She also notes that she gets short of breath and her heart rate goes up rapidly over 110-120 beats a minute with minimal exertion.  This apparently is a relatively new finding.  She does have a history of hypothyroidism but appears to be euthyroid on medication.  She takes Xanax, but mentioned it was mostly for sleep.  She does note some difficulty sleeping at night, has been told that she snores by her husband who was present during her office visit and she is found herself gasping for breath.  She occasionally wakes up with headaches in the morning and can feel tired during the day.  She completed the Epworth Sleepiness Scale and her score was 7.  Despite this she is at increased risk for sleep apnea.  Family history  significant for heart attack in her grandfather however no first-degree relatives with heart disease.  She is a non-smoker.  She drinks generally about 1 drink per day.  She is cut this back as well at her caffeine use.  01/23/2018  Mrs. Theriot returns today for follow-up of her stress test and monitor.  The stress test showed no ischemia normal LV function with LVEF of 69%.  Her monitor demonstrated sinus rhythm with predominantly PVCs, but no atrial fibrillation, nonsustained VT or other significant arrhythmias.  She reports that she is no longer aware of any palpitations.  We discussed further her sleep abnormalities my concern for a sleep disorder.  She says she has difficulty sleeping at night and did go for sleep study on May 31, however she felt that she could not sleep and became very anxious about the study.  She was then told if she did not complete the study she may have to pay several thousand dollars.  She decided not to go through with it.  We discussed this further today and I explained to her what is actually seen in the study.  We also discussed possibility of doing overnight home oximetry, but that it may lead to a formal sleep study if abnormal.  She says that she would prefer to rather work on weight loss at this time.  08/01/2018  Mrs. Mastrogiovanni is seen today in routine follow-up.  She is done well without any worsening palpitations over the past year.  She is tolerating Toprol without any significant side effects.  She reports her sleeping is somewhat better.  She does not feel fatigued in the morning.  She reports some snoring but not loud snoring or witnessed apnea.  She denies any chest pain or worsening shortness of breath.  08/07/2019  Ms. Swaggerty seen today for follow-up.  Overall she is doing well.  Unfortunately she had Covid in October.  She did have influenza-like symptoms with cough and shortness of breath as well as some hypoxia.  She was able to manage it at home and  recovered from her fatigue about a month later.  She says her husband who was also at home with her tested negative twice.  She reports her palpitations have improved significantly.  She denies any chest pain.  Blood pressure was initially elevated however came down to 134/84.  02/05/2019  Ms. Hallowell returns today for follow-up.  She has had no further TIA or stroke.  She is followed up with her neurologist.  She maintains on aspirin.  She had monitoring which showed SVT and possible A. fib however it was brief.  She denies any recurrent symptoms.  Her TEE, as reported demonstrated normal systolic function with no evidence of a PFO or intracardiac thrombus.  08/03/2023  Ms. Dotson is seen today in follow-up.  It has been a couple years since I last saw her.  She has been followed by our APP's.  She denies any recurrent stroke.  Blood pressure is reasonably well-controlled.  She has had a cold for about a week.  She sounds congested today.  She had lipids back in March.  LDL is above target at 90, total cholesterol 160, HDL 49 and triglycerides 117.  She is on rosuvastatin 20 mg daily.  She denies any recurrent SVT.  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    IBS (irritable bowel syndrome)    PAC (premature atrial contraction)    PVC (premature ventricular contraction)     Past Surgical History:  Procedure Laterality Date   BUBBLE STUDY  10/13/2020   Procedure: BUBBLE STUDY;  Surgeon: Chrystie Nose, MD;  Location: [facility] ENDOSCOPY;  Service: Cardiovascular;;   DIAGNOSTIC LAP BSO     KNEE ARTHROSCOPY Left 11/05/2018   Procedure: ARTHROSCOPY KNEE LEFT;  Surgeon: Donato Heinz, MD;  Location: [facility] ORS;   Service: Orthopedics;  Laterality: Left;   OOPHORECTOMY  07/21/2005   DIAGNOSTIC  LAPAROSCOPY WITH LYSIS OF ADHESIONS , BSO   TEE WITHOUT CARDIOVERSION N/A 10/13/2020   Procedure: TRANSESOPHAGEAL ECHOCARDIOGRAM (TEE);  Surgeon: Chrystie Nose, MD;  Location[facility] ENDOSCOPY;  Service: Cardiovascular;  Laterality: N/A;   VAGINAL HYSTERECTOMY  2001   VAGINAL     FAMHx:  Family History  Problem Relation Age of Onset   Heart disease Maternal Grandfather     SOCHx:   reports that she has never smoked. She has never used smokeless tobacco. She reports current alcohol use. She reports that she does not use drugs.  ALLERGIES:  Allergies  Allergen  Reactions   Morphine And Codeine Nausea And Vomiting   Zolpidem Other (See Comments)    Sleep walking.  "Sleep walk" Sleep walking.      ROS: Pertinent items noted in HPI and remainder of comprehensive ROS otherwise negative.  HOME MEDS: Current Outpatient Medications on File Prior to Visit  Medication Sig Dispense Refill   amLODipine (NORVASC) 5 MG tablet TAKE 1 TABLET (5 MG TOTAL) BY MOUTH DAILY. 90 tablet 3   aspirin EC 81 MG tablet Take 81 mg by mouth daily. Swallow whole.     levothyroxine (SYNTHROID, LEVOTHROID) 50 MCG tablet Take 50 mcg by mouth daily before breakfast.      metoprolol succinate (TOPROL-XL) 25 MG 24 hr tablet Take 1 tablet (25 mg total) by mouth daily. 90 tablet 2   Multiple Vitamin (MULTIVITAMIN) tablet Take 1 tablet by mouth daily.     omeprazole (PRILOSEC) 20 MG capsule Take 20 mg by mouth every evening.      ALPRAZolam (XANAX) 0.25 MG tablet Take 1 tablet (0.25 mg total) by mouth at bedtime as needed. (Patient not taking: Reported on 08/03/2023) 30 tablet 3   citalopram (CELEXA) 20 MG tablet Take 20 mg by mouth daily.     rosuvastatin (CRESTOR) 20 MG tablet Take 1 tablet (20 mg total) by mouth daily. 90 tablet 3   No current facility-administered medications on file prior to visit.    LABS/IMAGING: No results  found for this or any previous visit (from the past 48 hours). No results found.  LIPID PANEL:    Component Value Date/Time   CHOL 160 11/15/2022 1026   TRIG 117 11/15/2022 1026   HDL 49 11/15/2022 1026   CHOLHDL 3.3 11/15/2022 1026   LDLCALC 90 11/15/2022 1026    WEIGHTS: Wt Readings from Last 3 Encounters:  08/03/23 187 lb (84.8 kg)  11/02/22 182 lb 6.4 oz (82.7 kg)  08/03/22 181 lb 9.6 oz (82.4 kg)    VITALS: BP 132/74 (BP Location: Right Arm, Patient Position: Sitting, Cuff Size: Normal)   Pulse (!) 58   Ht 5\' 2"  (1.575 m)   Wt 187 lb (84.8 kg)   SpO2 94%   BMI 34.20 kg/m   EXAM: General appearance: alert and no distress Neck: no carotid bruit, no JVD and thyroid not enlarged, symmetric, no tenderness/mass/nodules Lungs: clear to auscultation bilaterally Heart: regular rate and rhythm, S1, S2 normal, no murmur, click, rub or gallop Abdomen: soft, non-tender; bowel sounds normal; no masses,  no organomegaly Extremities: extremities normal, atraumatic, no cyanosis or edema Pulses: 2+ and symmetric Skin: Skin color, texture, turgor normal. 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   Mrs. Lynd is doing well denies any recurrent stroke.  She said she had a recent MRI to evaluate some issues with memory.  She denies any recurrent palpitations or arrhythmias.  Cholesterol is above target LDL less than 70.  I like to recheck that today.  She  may need additional therapy and or more intensive  lifestyle modifications.  Plan follow-up with [place] otherwise annually or sooner as necessary.  Chrystie Nose, MD, [facility], FACP  [place]  [facility]  Medical Director of the Advanced Lipid Disorders &  Cardiovascular Risk Reduction Clinic Diplomate of the American Board of Clinical Lipidology Attending Cardiologist  Direct Dial: [phone]  Fax: [phone]  Website:  [email or web address] Alin [email or web address] 08/03/2023, 9:15 AM

## 2023-08-03 NOTE — Patient Instructions (Signed)
Medication Instructions:  NO CHANGES  *If you need a refill on your cardiac medications before your next appointment, please call your pharmacy*   Lab Work:  NMR lipoprofile and LPa today   If you have labs (blood work) drawn today and your tests are completely normal, you will receive your results only by: MyChart Message (if you have MyChart) OR A paper copy in the mail If you have any lab test that is abnormal or we need to change your treatment, we will call you to review the results.   Follow-Up: At [facility], you and your health needs are our priority.  As part of our continuing mission to provide you with exceptional heart care, we have created designated Provider Care Teams.  These Care Teams include your primary Cardiologist (physician) and Advanced Practice Providers (APPs -  Physician Assistants and Nurse Practitioners) who all work together to provide you with the care you need, when you need it.  We recommend signing up for the patient portal called "MyChart".  Sign up information is provided on this After Visit Summary.  MyChart is used to connect with patients for Virtual Visits (Telemedicine).  Patients are able to view lab/test results, encounter notes, upcoming appointments, etc.  Non-urgent messages can be sent to your provider as well.   To learn more about what you can do with MyChart, go to [email or web address].    Your next appointment:    12 months with Dr. Rennis Golden -- call in July or August for next visit

## 2023-08-04 LAB — NMR, LIPOPROFILE
Cholesterol, Total: 125 mg/dL (ref 100–199)
HDL Particle Number: 30.3 umol/L — ABNORMAL LOW (ref >=30.5)
HDL-C: 32 mg/dL — ABNORMAL LOW (ref >39)
LDL Particle Number: 1169 nmol/L — ABNORMAL HIGH (ref <1000)
LDL Size: 20 nmol — ABNORMAL LOW (ref >20.5)
LDL-C (NIH Calc): 71 mg/dL (ref 0–99)
LP-IR Score: 64 — ABNORMAL HIGH (ref <=45)
Small LDL Particle Number: 791 nmol/L — ABNORMAL HIGH (ref <=527)
Triglycerides: 118 mg/dL (ref 0–149)

## 2023-08-04 LAB — LIPOPROTEIN A (LPA): Lipoprotein (a): 109.4 nmol/L — ABNORMAL HIGH (ref <75.0)

## 2023-09-05 ENCOUNTER — Telehealth: Payer: Self-pay

## 2023-09-05 ENCOUNTER — Other Ambulatory Visit: Payer: Self-pay | Admitting: *Deleted

## 2023-09-05 ENCOUNTER — Other Ambulatory Visit: Payer: Self-pay | Admitting: Internal Medicine

## 2023-09-05 DIAGNOSIS — Z8673 Personal history of transient ischemic attack (TIA), and cerebral infarction without residual deficits: Secondary | ICD-10-CM

## 2023-09-05 DIAGNOSIS — E7841 Elevated Lipoprotein(a): Secondary | ICD-10-CM

## 2023-09-05 DIAGNOSIS — E785 Hyperlipidemia, unspecified: Secondary | ICD-10-CM

## 2023-09-05 NOTE — Telephone Encounter (Signed)
 Dr. Mona and Jenna, patient will be scheduled as soon as possible.  Auth Submission: NO AUTH NEEDED Site of care: Site of care: [facility] Payer: Medicare A/B with [employer] supplement Medication & CPT/J Code(s) submitted: Leqvio  (Inclisiran) J1306 Route of submission (phone, fax, portal):  Phone # Fax # Auth type: Buy/Bill PB Units/visits requested: 284mg  x 3 doses Reference number:  Approval from: 09/05/23 to 09/21/24   Medicare A/B will cover 80%, [employer] supplement will cover 20%.

## 2023-09-12 ENCOUNTER — Ambulatory Visit: Payer: Medicare Other

## 2023-09-12 VITALS — BP 135/79 | HR 56 | Temp 98.1°F | Resp 16 | Ht 62.0 in | Wt 188.0 lb

## 2023-09-12 DIAGNOSIS — E785 Hyperlipidemia, unspecified: Secondary | ICD-10-CM

## 2023-09-12 MED ORDER — INCLISIRAN SODIUM 284 MG/1.5ML ~~LOC~~ SOSY
284.0000 mg | PREFILLED_SYRINGE | Freq: Once | SUBCUTANEOUS | Status: AC
Start: 2023-09-12 — End: 2023-09-12
  Administered 2023-09-12: 284 mg via SUBCUTANEOUS
  Filled 2023-09-12: qty 1.5

## 2023-09-12 NOTE — Progress Notes (Signed)
Diagnosis: Hyperlipidemia  Provider:  Chilton Greathouse MD  Procedure: Injection  Leqvio (inclisiran), Dose: 284 mg, Site: subcutaneous, Number of injections: 1  Injection Site(s): Left arm  Post Care: Observation period completed  Discharge: Condition: Stable, Destination: Home . AVS Provided  Performed by:  Wyvonne Lenz, RN

## 2023-09-25 ENCOUNTER — Other Ambulatory Visit: Payer: Self-pay | Admitting: *Deleted

## 2023-09-25 DIAGNOSIS — E785 Hyperlipidemia, unspecified: Secondary | ICD-10-CM

## 2023-09-25 DIAGNOSIS — E7841 Elevated Lipoprotein(a): Secondary | ICD-10-CM

## 2023-09-25 DIAGNOSIS — Z8673 Personal history of transient ischemic attack (TIA), and cerebral infarction without residual deficits: Secondary | ICD-10-CM

## 2023-09-25 NOTE — Telephone Encounter (Signed)
Lab work orders mailed -- to be done late May/early June. No follow up after labs needed, just routine visits (per recalls)

## 2023-12-13 ENCOUNTER — Ambulatory Visit: Payer: Medicare Other

## 2023-12-19 ENCOUNTER — Ambulatory Visit (INDEPENDENT_AMBULATORY_CARE_PROVIDER_SITE_OTHER)

## 2023-12-19 ENCOUNTER — Ambulatory Visit

## 2023-12-19 VITALS — BP 135/84 | HR 65 | Temp 98.2°F | Resp 20 | Ht 62.0 in | Wt 198.2 lb

## 2023-12-19 DIAGNOSIS — E785 Hyperlipidemia, unspecified: Secondary | ICD-10-CM

## 2023-12-19 MED ORDER — INCLISIRAN SODIUM 284 MG/1.5ML ~~LOC~~ SOSY
284.0000 mg | PREFILLED_SYRINGE | Freq: Once | SUBCUTANEOUS | Status: AC
Start: 2023-12-19 — End: ?

## 2023-12-19 MED ORDER — INCLISIRAN SODIUM 284 MG/1.5ML ~~LOC~~ SOSY
284.0000 mg | PREFILLED_SYRINGE | Freq: Once | SUBCUTANEOUS | Status: AC
  Administered 2023-12-19: 284 mg via SUBCUTANEOUS
  Filled 2023-12-19: qty 1.5

## 2023-12-19 NOTE — Progress Notes (Signed)
 Diagnosis: Hyperlipidemia  Provider:  Chilton Greathouse MD  Procedure: Injection  Leqvio (inclisiran), Dose: 284 mg, Site: subcutaneous, Number of injections: 1  Injection Site(s): Right arm  Post Care: Patient declined observation  Discharge: Condition: Stable, Destination: Home . AVS Provided  Performed by:  Wyvonne Lenz, RN

## 2024-06-20 ENCOUNTER — Ambulatory Visit

## 2024-06-20 MED ORDER — INCLISIRAN SODIUM 284 MG/1.5ML ~~LOC~~ SOSY: 284.0000 mg | PREFILLED_SYRINGE | Freq: Once | SUBCUTANEOUS | Status: DC

## 2024-06-21 ENCOUNTER — Encounter: Payer: Self-pay | Admitting: Internal Medicine

## 2024-06-21 ENCOUNTER — Ambulatory Visit

## 2024-06-21 VITALS — BP 124/79 | HR 67 | Temp 97.7°F | Resp 20 | Ht 62.0 in | Wt 191.2 lb

## 2024-06-21 DIAGNOSIS — E785 Hyperlipidemia, unspecified: Secondary | ICD-10-CM

## 2024-06-21 MED ORDER — INCLISIRAN SODIUM 284 MG/1.5ML ~~LOC~~ SOSY
284.0000 mg | PREFILLED_SYRINGE | Freq: Once | SUBCUTANEOUS | Status: AC
  Administered 2024-06-21: 284 mg via SUBCUTANEOUS
  Filled 2024-06-21: qty 1.5

## 2024-06-21 NOTE — Progress Notes (Signed)
 Diagnosis: Hyperlipidemia  Provider:  Chilton Greathouse MD  Procedure: Injection  Leqvio (inclisiran), Dose: 284 mg, Site: subcutaneous, Number of injections: 1  Injection Site(s): Right arm  Post Care:  right arm injection  Discharge: Condition: Good, Destination: Home . AVS Declined  Performed by:  Rico Ala, LPN

## 2024-08-30 ENCOUNTER — Telehealth: Payer: Self-pay

## 2024-08-30 NOTE — Telephone Encounter (Signed)
 Auth Submission: NO AUTH NEEDED Site of care: Site of care: [facility] Payer: Medicare A/B with [employer] supplement Medication & CPT/J Code(s) submitted: Leqvio  (Inclisiran) J1306 Diagnosis Code:  Route of submission (phone, fax, portal):  Phone # Fax # Auth type: Buy/Bill PB Units/visits requested: 284mg  x 2 doses Reference number:  Approval from: 08/30/24 to 09/21/25

## 2024-12-20 ENCOUNTER — Ambulatory Visit
